# Patient Record
Sex: Female | Born: 1945
Health system: Southern US, Community
[De-identification: ages and names within clinical notes are randomized; demographics above are authoritative.]

## PROBLEM LIST (undated history)

## (undated) DIAGNOSIS — I8392 Asymptomatic varicose veins of left lower extremity: Secondary | ICD-10-CM

## (undated) DIAGNOSIS — E7800 Pure hypercholesterolemia, unspecified: Secondary | ICD-10-CM

## (undated) DIAGNOSIS — I10 Essential (primary) hypertension: Secondary | ICD-10-CM

## (undated) DIAGNOSIS — E119 Type 2 diabetes mellitus without complications: Secondary | ICD-10-CM

## (undated) DIAGNOSIS — M1990 Unspecified osteoarthritis, unspecified site: Secondary | ICD-10-CM

## (undated) HISTORY — PX: TUBAL LIGATION: SHX77

## (undated) HISTORY — PX: TONSILLECTOMY: SUR1361

## (undated) HISTORY — PX: COLONOSCOPY: SHX174

## (undated) HISTORY — DX: Type 2 diabetes mellitus without complications: E11.9

## (undated) HISTORY — DX: Unspecified osteoarthritis, unspecified site: M19.90

## (undated) HISTORY — PX: CATARACT EXTRACTION, BILATERAL: SHX1313

## (undated) HISTORY — PX: EYE SURGERY: SHX253

## (undated) HISTORY — DX: Asymptomatic varicose veins of left lower extremity: I83.92

---

## 1998-08-20 ENCOUNTER — Other Ambulatory Visit: Admission: RE | Admit: 1998-08-20 | Discharge: 1998-08-20 | Payer: Self-pay | Admitting: Gynecology

## 2000-03-15 ENCOUNTER — Other Ambulatory Visit: Admission: RE | Admit: 2000-03-15 | Discharge: 2000-03-15 | Payer: Self-pay | Admitting: Gynecology

## 2001-06-26 ENCOUNTER — Other Ambulatory Visit: Admission: RE | Admit: 2001-06-26 | Discharge: 2001-06-26 | Payer: Self-pay | Admitting: Gynecology

## 2002-08-09 ENCOUNTER — Other Ambulatory Visit: Admission: RE | Admit: 2002-08-09 | Discharge: 2002-08-09 | Payer: Self-pay | Admitting: Gynecology

## 2003-09-26 ENCOUNTER — Other Ambulatory Visit: Admission: RE | Admit: 2003-09-26 | Discharge: 2003-09-26 | Payer: Self-pay | Admitting: Gynecology

## 2004-12-24 ENCOUNTER — Other Ambulatory Visit: Admission: RE | Admit: 2004-12-24 | Discharge: 2004-12-24 | Payer: Self-pay | Admitting: Gynecology

## 2008-02-21 ENCOUNTER — Encounter: Admission: RE | Admit: 2008-02-21 | Discharge: 2008-02-21 | Payer: Self-pay | Admitting: Family Medicine

## 2010-08-11 ENCOUNTER — Other Ambulatory Visit: Payer: Self-pay | Admitting: Family Medicine

## 2010-08-11 DIAGNOSIS — Z1231 Encounter for screening mammogram for malignant neoplasm of breast: Secondary | ICD-10-CM

## 2010-08-11 DIAGNOSIS — Z780 Asymptomatic menopausal state: Secondary | ICD-10-CM

## 2010-08-26 ENCOUNTER — Ambulatory Visit
Admission: RE | Admit: 2010-08-26 | Discharge: 2010-08-26 | Disposition: A | Discharge status: Home / Self Care | Payer: BC Managed Care – PPO | Source: Ambulatory Visit | Attending: Family Medicine | Admitting: Family Medicine

## 2010-08-26 DIAGNOSIS — Z1231 Encounter for screening mammogram for malignant neoplasm of breast: Secondary | ICD-10-CM

## 2010-08-26 DIAGNOSIS — Z780 Asymptomatic menopausal state: Secondary | ICD-10-CM

## 2011-09-22 ENCOUNTER — Other Ambulatory Visit: Payer: Self-pay | Admitting: Family Medicine

## 2011-09-22 DIAGNOSIS — Z1231 Encounter for screening mammogram for malignant neoplasm of breast: Secondary | ICD-10-CM

## 2011-10-04 ENCOUNTER — Ambulatory Visit
Admission: RE | Admit: 2011-10-04 | Discharge: 2011-10-04 | Disposition: A | Discharge status: Home / Self Care | Payer: BC Managed Care – PPO | Source: Ambulatory Visit | Attending: Family Medicine | Admitting: Family Medicine

## 2011-10-04 DIAGNOSIS — Z1231 Encounter for screening mammogram for malignant neoplasm of breast: Secondary | ICD-10-CM

## 2012-10-09 ENCOUNTER — Telehealth: Payer: Self-pay | Admitting: Family Medicine

## 2012-10-09 NOTE — Telephone Encounter (Signed)
BP been running very low.  Has had some near syncopal episodes.  98/48 last pm  Then 106/55 later.  This am 102/53  Did not take BP med.  Taking HCTZ and Atenolol. Concerned  What should she do??  Per Dr Tanya Nones,  Have pt hold BP meds for 1 week.  Schedule appt to see provider then for follow up in one week.  Keep BP log and bring to appt.  Call sooner if problem.

## 2012-11-11 ENCOUNTER — Emergency Department (INDEPENDENT_AMBULATORY_CARE_PROVIDER_SITE_OTHER)
Admission: EM | Admit: 2012-11-11 | Discharge: 2012-11-11 | Disposition: A | Discharge status: Home / Self Care | Payer: Worker's Compensation | Source: Home / Self Care | Attending: Emergency Medicine | Admitting: Emergency Medicine

## 2012-11-11 ENCOUNTER — Encounter (HOSPITAL_COMMUNITY): Payer: Self-pay

## 2012-11-11 DIAGNOSIS — S60229A Contusion of unspecified hand, initial encounter: Secondary | ICD-10-CM

## 2012-11-11 DIAGNOSIS — S60222A Contusion of left hand, initial encounter: Secondary | ICD-10-CM

## 2012-11-11 DIAGNOSIS — S8000XA Contusion of unspecified knee, initial encounter: Secondary | ICD-10-CM

## 2012-11-11 DIAGNOSIS — S8002XA Contusion of left knee, initial encounter: Secondary | ICD-10-CM

## 2012-11-11 DIAGNOSIS — S60221A Contusion of right hand, initial encounter: Secondary | ICD-10-CM

## 2012-11-11 DIAGNOSIS — S8001XA Contusion of right knee, initial encounter: Secondary | ICD-10-CM

## 2012-11-11 DIAGNOSIS — W010XXA Fall on same level from slipping, tripping and stumbling without subsequent striking against object, initial encounter: Secondary | ICD-10-CM

## 2012-11-11 HISTORY — DX: Essential (primary) hypertension: I10

## 2012-11-11 HISTORY — DX: Pure hypercholesterolemia, unspecified: E78.00

## 2012-11-11 NOTE — ED Provider Notes (Signed)
Medical screening examination/treatment/procedure(s) were performed by non-physician practitioner and as supervising physician I was immediately available for consultation/collaboration.  Leslee Home, M.D.  Reuben Likes, MD 11/11/12 2004

## 2012-11-11 NOTE — ED Notes (Signed)
WC, [employer] system, states she tripped over cord and landed on both knees and hands yesterday

## 2012-11-11 NOTE — ED Provider Notes (Signed)
History     CSN: [id number]  Arrival date & time 11/11/12  1441   First MD Initiated Contact with Patient 11/11/12 1631      Chief Complaint  Patient presents with  . Fall    (Consider location/radiation/quality/duration/timing/severity/associated sxs/prior treatment) HPI Comments: Pleasant 67 year old female was at work yesterday and tripped over a cord. She landed on her knees and hands. She states she laid there and cried for a few moments then got up and walked away. Over the night and today she has had soreness in the anterior knees and in the palm of the hands. She denies problems with ambulation or weightbearing. And she denies problems with function of the hands. She did not injure her head neck, back, chest, abdomen, hips or other areas. She is fully awake, alert, oriented with normal speech, content, in And memory.   Past Medical History  Diagnosis Date  . Hypertension   . High cholesterol     History reviewed. No pertinent past surgical history.  History reviewed. No pertinent family history.  History  Substance Use Topics  . Smoking status: Not on file  . Smokeless tobacco: Not on file  . Alcohol Use: Not on file    OB History   Grav Para Term Preterm Abortions TAB SAB Ect Mult Living                  Review of Systems  Constitutional: Negative.   Respiratory: Negative.   Gastrointestinal: Negative.   Genitourinary: Negative.   Musculoskeletal: Positive for arthralgias.  Skin: Negative.   Neurological: Negative.   Psychiatric/Behavioral: Negative.     Allergies  Review of patient's allergies indicates no known allergies.  Home Medications   Current Outpatient Rx  Name  Route  Sig  Dispense  Refill  . atenolol (TENORMIN) 25 MG tablet   Oral   Take 25 mg by mouth daily.         . hydrochlorothiazide (MICROZIDE) 12.5 MG capsule   Oral   Take 12.5 mg by mouth daily.         Marland Kitchen lovastatin (MEVACOR) 10 MG tablet   Oral   Take 10 mg by  mouth at bedtime.           BP 116/54  Pulse 59  Temp(Src) 98.3 F (36.8 C) (Oral)  Resp 16  SpO2 100%  Physical Exam  Nursing note and vitals reviewed. Constitutional: She is oriented to person, place, and time. She appears well-developed and well-nourished. No distress.  HENT:  Head: Normocephalic and atraumatic.  Eyes: EOM are normal. Pupils are equal, round, and reactive to light.  Neck: Normal range of motion. Neck supple.  Cardiovascular: Normal heart sounds.   Pulmonary/Chest: Effort normal. No respiratory distress.  Musculoskeletal: Normal range of motion. She exhibits no edema.  Inspection and palpation of the knees reveals no asymmetry, discoloration or deformity. No tenderness or pain with torsion, varus valgus movement or drawer testing. She is ambulatory with a smooth imbalanced gait. And exam reveals minor superficial ecchymosis at the proximal hand palmar surface between the thenar and hyperthenar prominences. Denies wrist pain. Full range of motion of all digits, thumb opposition and wrist bilaterally. Distal neurovascular motor sensory intact.  Lymphadenopathy:    She has cervical adenopathy.  Neurological: She is alert and oriented to person, place, and time. No cranial nerve deficit. She exhibits normal muscle tone.  Skin: Skin is warm and dry.  Psychiatric: She has a normal mood and affect.  ED Course  Procedures (including critical care time)  Labs Reviewed - No data to display No results found.   1. Contusion of right knee, initial encounter   2. Contusion of left knee, initial encounter   3. Contusion, hand, left, initial encounter   4. Contusion, hand, right, initial encounter   5. Fall due to stumbling, initial encounter       MDM  Ice to the  areas of soreness for the next 2 or 3 days. Limit extra walking or climbing stairs for the next few days. Any symptoms problems or worsening may return, otherwise follow up with primary care  doctor. Written instructions on contusions of hands and knees.        Hayden Rasmussen, NP 11/11/12 1730

## 2013-01-12 ENCOUNTER — Telehealth: Payer: Self-pay | Admitting: Family Medicine

## 2013-01-12 MED ORDER — LOVASTATIN 10 MG PO TABS: 10.0000 mg | ORAL_TABLET | Freq: Every day | ORAL | Status: DC

## 2013-01-12 NOTE — Telephone Encounter (Signed)
Med refill

## 2013-01-13 ENCOUNTER — Other Ambulatory Visit: Payer: Self-pay | Admitting: Family Medicine

## 2013-03-13 ENCOUNTER — Ambulatory Visit: Payer: Self-pay | Admitting: Family Medicine

## 2013-03-14 ENCOUNTER — Ambulatory Visit (INDEPENDENT_AMBULATORY_CARE_PROVIDER_SITE_OTHER): Payer: BC Managed Care – PPO | Admitting: Physician Assistant

## 2013-03-14 ENCOUNTER — Encounter: Payer: Self-pay | Admitting: Physician Assistant

## 2013-03-14 VITALS — BP 116/60 | HR 72 | Temp 98.1°F | Resp 18 | Ht 61.0 in | Wt 174.0 lb

## 2013-03-14 DIAGNOSIS — E7800 Pure hypercholesterolemia, unspecified: Secondary | ICD-10-CM | POA: Insufficient documentation

## 2013-03-14 DIAGNOSIS — H6690 Otitis media, unspecified, unspecified ear: Secondary | ICD-10-CM

## 2013-03-14 DIAGNOSIS — I10 Essential (primary) hypertension: Secondary | ICD-10-CM | POA: Insufficient documentation

## 2013-03-14 DIAGNOSIS — H6692 Otitis media, unspecified, left ear: Secondary | ICD-10-CM

## 2013-03-14 MED ORDER — AMOXICILLIN-POT CLAVULANATE 875-125 MG PO TABS: 1.0000 | ORAL_TABLET | Freq: Two times a day (BID) | ORAL | Status: DC

## 2013-03-14 NOTE — Progress Notes (Signed)
Patient ID: Nichole Bryant MRN: [id number], DOB: Oct 21, 1945, 67 y.o. Date of Encounter: 03/14/2013, 11:35 AM    Chief Complaint:  Chief Complaint  Patient presents with  . severe lt ear pain x 4 days , fever     HPI: 67 y.o. year old white female reports that over the last 3-4 days she has been having some intermittent pain in the left ear. She has been taking either Tylenol or Motrin prior to going to bed for the last several nights. However she will wake around 4 AM with severe pain in the left ear. As well yesterday she had a fever of 101. She says that she was unable to get an appointment here since she went to an urgent care yesterday. She shows me the bottle that they prescribed it has to some cough syrup. She tells me she is having very little drainage from her nose. No significant mucous. She has no cough or chest congestion. No sore throat. She does say that sometimes when she swallows she can feel the pain in the left ear. She's had no tooth ache or pain in her teeth or oral mucosa. No pain in the TM joint or her neck her mastoid area. She has no past history of problems with a tear or this area of her body.     Home Meds: See attached medication section for any medications that were entered at today's visit. The computer does not put those onto this list.The following list is a list of meds entered prior to today's visit.   Current Outpatient Prescriptions on File Prior to Visit  Medication Sig Dispense Refill  . atenolol (TENORMIN) 25 MG tablet Take 25 mg by mouth daily.      . hydrochlorothiazide (MICROZIDE) 12.5 MG capsule Take 12.5 mg by mouth daily.      Marland Kitchen lovastatin (MEVACOR) 20 MG tablet TAKE 1 TABLET EVERY DAY  30 tablet  3   No current facility-administered medications on file prior to visit.    Allergies: No Known Allergies    Review of Systems: See HPI for pertinent ROS. All other ROS negative.    Physical Exam: Blood pressure 116/60, pulse 72,  temperature 98.1 F (36.7 C), temperature source Oral, resp. rate 18, height 5\' 1"  (1.549 m), weight 174 lb (78.926 kg)., Body mass index is 32.89 kg/(m^2). General: Well-nourished well-developed white female. Appears in no acute distress. HEENT: Normocephalic, atraumatic, eyes without discharge, sclera non-icteric, nares are without discharge. Bilateral auditory canals clear, TM's are without perforation, pearly grey and translucent with reflective cone of light bilaterally. Oral cavity moist, posterior pharynx without exudate, erythema, peritonsillar abscess, or post nasal drip. Palpation of the external left ear is normal with no pain. The ear canal is normal with no erythema or edema and no purulent drainage. It is no cerumen present. Her TM is well visualized. it is slightly dull and golden colored but otherwise normal. Gums and oral mucosa are normal  Patient at the TM joint induces no pain. Palpation of the mastoid area and the neck produces no pain. No mass with palpation of the area. Neck: Supple. No thyromegaly. No lymphadenopathy. Lungs: Clear bilaterally to auscultation without wheezes, rales, or rhonchi. Breathing is unlabored. Heart: Regular rhythm. No murmurs, rubs, or gallops. Msk:  Strength and tone normal for age. Neuro: Alert and oriented X 3. Moves all extremities spontaneously. Gait is normal. CNII-XII grossly in tact. Psych:  Responds to questions appropriately with a normal affect.  ASSESSMENT AND PLAN:  67 y.o. year old female with  1. Otitis media, left - amoxicillin-clavulanate (AUGMENTIN) 875-125 MG per tablet; Take 1 tablet by mouth 2 (two) times daily.  Dispense: 20 tablet; Refill: 0 Continue to take Tylenol or Motrin before bedtime each night for the next 2 nights. She can then decrease to when necessary pain. complete all of the antibiotic. If her symptoms do not resolve then followup with [place]. If this is the case she may need followup with ENT to further  investigate the cause of her symptoms.  Signed, [address] [place], [place], [facility] 03/14/2013 11:35 AM

## 2013-03-15 ENCOUNTER — Ambulatory Visit (INDEPENDENT_AMBULATORY_CARE_PROVIDER_SITE_OTHER): Payer: BC Managed Care – PPO | Admitting: Family Medicine

## 2013-03-15 ENCOUNTER — Encounter: Payer: Self-pay | Admitting: Family Medicine

## 2013-03-15 VITALS — BP 100/72 | HR 70 | Temp 97.0°F | Resp 16 | Wt 173.0 lb

## 2013-03-15 DIAGNOSIS — H9202 Otalgia, left ear: Secondary | ICD-10-CM

## 2013-03-15 DIAGNOSIS — H6690 Otitis media, unspecified, unspecified ear: Secondary | ICD-10-CM

## 2013-03-15 DIAGNOSIS — H6692 Otitis media, unspecified, left ear: Secondary | ICD-10-CM | POA: Insufficient documentation

## 2013-03-15 DIAGNOSIS — T7840XA Allergy, unspecified, initial encounter: Secondary | ICD-10-CM

## 2013-03-15 DIAGNOSIS — H9209 Otalgia, unspecified ear: Secondary | ICD-10-CM | POA: Insufficient documentation

## 2013-03-15 MED ORDER — ANTIPYRINE-BENZOCAINE 5.4-1.4 % OT SOLN: 3.0000 [drp] | Freq: Four times a day (QID) | OTIC | Status: DC | PRN

## 2013-03-15 MED ORDER — PREDNISONE 10 MG PO TABS: ORAL_TABLET | ORAL | Status: DC

## 2013-03-15 NOTE — Assessment & Plan Note (Signed)
I think this is likely a drug reaction with her sulfa drug. I will have her stop the sulfa drug as well as the amoxicillin. She's been given Depo-Medrol today she will start prednisone taper tomorrow.

## 2013-03-15 NOTE — Assessment & Plan Note (Signed)
I do not see any evidence of otitis media or otitis externa based on exam but she is having severe pain. I will go ahead and set her up with ear nose and throat tomorrow afternoon. She was given a peak otic drops to see if this help the prednisone that she is on for the rash may also help with some of the pain. I do not see evidence of TMJ but this is a possibility

## 2013-03-15 NOTE — Patient Instructions (Signed)
Stop Sulfa Drug Do not start amoxicillin Referral to ENT Ear drop given Start prednisone tablets tomorrow Okay to take benadryl along with medications F/U as previous or call if rash does not improve

## 2013-03-15 NOTE — Progress Notes (Signed)
  Subjective:    Patient ID: Nichole Bryant, female    DOB: Aug 16, 1945, 67 y.o.   MRN: [id number]  HPI  Patient presents with allergic reaction and severe left ear pain. He was seen by late June at urgent care on September 1 was diagnosed with left otitis media and was given prescription for Bactrim one tablet twice a day for 10 days. This is day 9 of the Bactrim. She was seen yesterday secondary to continued severe ear pain despite antibiotic uses an over-the-counter ear drop medication as well as ibuprofen for her ear pain. He denies any drainage from the ear but states that she gets severe sharp shooting pains from the year every few minutes which are unbearable at times. She denies any sinus drainage. He noticed yesterday morning that she began breaking out in a record rash on her chest and back. She did take a dose of amoxicillin last night however the rash was already present. The rash is very itchy in nature is down spreading across her abdomen as well as her arms and legs. She denies any fever , N/V.   Review of Systems   GEN- denies fatigue, fever, weight loss,weakness, recent illness HEENT- denies eye drainage, change in vision, nasal discharge, CVS- denies chest pain, palpitations RESP- denies SOB, cough, wheeze Neuro- denies headache, dizziness, syncope, seizure activity Skin- + rash      Objective:   Physical Exam  GEN- NAD, alert and oriented x3 HEENT- PERRL, EOMI, non injected sclera, pink conjunctiva, MMM, oropharynx clear, TM clear bilat no effusion, no erythema noted, pain with manipulation of pinna, canal clear, no discharge, NT over mastoid, NT over TMJ, no maxillary sinus tenderness, Hearing grossly in tact  Neck- Supple, no LAD CVS- RRR, no murmur RESP-CTAB EXT- No edema Pulses- Radial 2+ Skin- generalized erythematous blanching macular rash over trunk, arms, legs, face spared, no blistering lesions, no pustules, no vesicles        Assessment & Plan:

## 2013-03-15 NOTE — Assessment & Plan Note (Signed)
Refer to ear nose and throat 

## 2013-03-21 ENCOUNTER — Other Ambulatory Visit: Payer: Self-pay | Admitting: Physician Assistant

## 2013-04-19 ENCOUNTER — Telehealth: Payer: Self-pay | Admitting: Family Medicine

## 2013-04-19 ENCOUNTER — Encounter: Payer: Self-pay | Admitting: Family Medicine

## 2013-04-19 MED ORDER — HYDROCHLOROTHIAZIDE 12.5 MG PO CAPS: 12.5000 mg | ORAL_CAPSULE | Freq: Every day | ORAL | Status: DC

## 2013-04-19 NOTE — Telephone Encounter (Signed)
HCTZ 12.5 mg QD

## 2013-04-19 NOTE — Telephone Encounter (Signed)
Medication refill for one time only.  Patient needs to be seen.  Letter sent for patient to call and schedule 

## 2013-05-07 ENCOUNTER — Ambulatory Visit: Payer: BC Managed Care – PPO | Admitting: Family Medicine

## 2013-05-12 ENCOUNTER — Other Ambulatory Visit: Payer: Self-pay | Admitting: Family Medicine

## 2013-05-14 ENCOUNTER — Ambulatory Visit: Payer: BC Managed Care – PPO | Admitting: Family Medicine

## 2013-05-14 ENCOUNTER — Other Ambulatory Visit: Payer: BC Managed Care – PPO

## 2013-05-14 DIAGNOSIS — Z79899 Other long term (current) drug therapy: Secondary | ICD-10-CM

## 2013-05-14 DIAGNOSIS — E785 Hyperlipidemia, unspecified: Secondary | ICD-10-CM

## 2013-05-14 DIAGNOSIS — I10 Essential (primary) hypertension: Secondary | ICD-10-CM

## 2013-05-14 LAB — CBC WITH DIFFERENTIAL/PLATELET
Basophils Absolute: 0 10*3/uL (ref 0.0–0.1)
Basophils Relative: 0 % (ref 0–1)
Eosinophils Relative: 2 % (ref 0–5)
Lymphocytes Relative: 32 % (ref 12–46)
MCHC: 33.3 g/dL (ref 30.0–36.0)
Neutro Abs: 4.1 10*3/uL (ref 1.7–7.7)
Platelets: 227 10*3/uL (ref 150–400)
RDW: 13.8 % (ref 11.5–15.5)
WBC: 7.1 10*3/uL (ref 4.0–10.5)

## 2013-05-14 LAB — LIPID PANEL
HDL: 51 mg/dL (ref >39)
LDL Cholesterol: 102 mg/dL — ABNORMAL HIGH (ref 0–99)
Total CHOL/HDL Ratio: 3.3 Ratio

## 2013-05-14 LAB — COMPREHENSIVE METABOLIC PANEL
ALT: 13 U/L (ref 0–35)
AST: 17 U/L (ref 0–37)
Alkaline Phosphatase: 56 U/L (ref 39–117)
Calcium: 9.6 mg/dL (ref 8.4–10.5)
Chloride: 102 mEq/L (ref 96–112)
Creat: 0.86 mg/dL (ref 0.50–1.10)
Potassium: 5 mEq/L (ref 3.5–5.3)

## 2013-05-26 ENCOUNTER — Other Ambulatory Visit: Payer: Self-pay | Admitting: Family Medicine

## 2013-05-28 ENCOUNTER — Encounter: Payer: Self-pay | Admitting: Family Medicine

## 2013-05-28 ENCOUNTER — Ambulatory Visit (INDEPENDENT_AMBULATORY_CARE_PROVIDER_SITE_OTHER): Payer: BC Managed Care – PPO | Admitting: Family Medicine

## 2013-05-28 VITALS — BP 126/80 | HR 68 | Temp 98.2°F | Resp 16 | Wt 169.0 lb

## 2013-05-28 DIAGNOSIS — R7303 Prediabetes: Secondary | ICD-10-CM

## 2013-05-28 DIAGNOSIS — R7309 Other abnormal glucose: Secondary | ICD-10-CM

## 2013-05-28 LAB — HEMOGLOBIN A1C
Hgb A1c MFr Bld: 6.1 % — ABNORMAL HIGH (ref <5.7)
Mean Plasma Glucose: 128 mg/dL — ABNORMAL HIGH (ref <117)

## 2013-05-28 MED ORDER — ATENOLOL 25 MG PO TABS: ORAL_TABLET | ORAL | Status: DC

## 2013-05-28 MED ORDER — LOVASTATIN 20 MG PO TABS: ORAL_TABLET | ORAL | Status: DC

## 2013-05-28 MED ORDER — HYDROCHLOROTHIAZIDE 12.5 MG PO CAPS: ORAL_CAPSULE | ORAL | Status: DC

## 2013-05-28 NOTE — Progress Notes (Signed)
Subjective:    Patient ID: Nichole Bryant, female    DOB: Nov 25, 1945, 67 y.o.   MRN: [id number]  HPI  Patient continues to experience numbness in both feet. She has peripheral neuropathy.  She denies any pain in her feet. Her blood pressures currently well controlled on atenolol and hydrochlorothiazide. She denies any chest pain shortness of breath or dyspnea on exertion. Her LDL is at goal at 102 given that she is now prediabetic. She is currently taking lovastatin 20 mg by mouth daily. She denies any myalgias or right upper quadrant pain. Her most recent labwork as listed below: Lab on 05/14/2013  Component Date Value Range Status  . WBC 05/14/2013 7.1  4.0 - 10.5 K/uL Final  . RBC 05/14/2013 4.76  3.87 - 5.11 MIL/uL Final  . Hemoglobin 05/14/2013 13.2  12.0 - 15.0 g/dL Final  . HCT 40/98/1191 39.6  36.0 - 46.0 % Final  . MCV 05/14/2013 83.2  78.0 - 100.0 fL Final  . MCH 05/14/2013 27.7  26.0 - 34.0 pg Final  . MCHC 05/14/2013 33.3  30.0 - 36.0 g/dL Final  . RDW 47/82/9562 13.8  11.5 - 15.5 % Final  . Platelets 05/14/2013 227  150 - 400 K/uL Final  . Neutrophils Relative % 05/14/2013 58  43 - 77 % Final  . Neutro Abs 05/14/2013 4.1  1.7 - 7.7 K/uL Final  . Lymphocytes Relative 05/14/2013 32  12 - 46 % Final  . Lymphs Abs 05/14/2013 2.3  0.7 - 4.0 K/uL Final  . Monocytes Relative 05/14/2013 8  3 - 12 % Final  . Monocytes Absolute 05/14/2013 0.6  0.1 - 1.0 K/uL Final  . Eosinophils Relative 05/14/2013 2  0 - 5 % Final  . Eosinophils Absolute 05/14/2013 0.1  0.0 - 0.7 K/uL Final  . Basophils Relative 05/14/2013 0  0 - 1 % Final  . Basophils Absolute 05/14/2013 0.0  0.0 - 0.1 K/uL Final  . Smear Review 05/14/2013 Criteria for review not met   Final  . Sodium 05/14/2013 138  135 - 145 mEq/L Final  . Potassium 05/14/2013 5.0  3.5 - 5.3 mEq/L Final  . Chloride 05/14/2013 102  96 - 112 mEq/L Final  . CO2 05/14/2013 30  19 - 32 mEq/L Final  . Glucose, Bld 05/14/2013 124* 70 - 99 mg/dL  Final  . BUN 13/02/6577 18  6 - 23 mg/dL Final  . Creat 46/96/2952 0.86  0.50 - 1.10 mg/dL Final  . Total Bilirubin 05/14/2013 0.7  0.3 - 1.2 mg/dL Final  . Alkaline Phosphatase 05/14/2013 56  39 - 117 U/L Final  . AST 05/14/2013 17  0 - 37 U/L Final  . ALT 05/14/2013 13  0 - 35 U/L Final  . Total Protein 05/14/2013 6.6  6.0 - 8.3 g/dL Final  . Albumin 84/13/2440 4.4  3.5 - 5.2 g/dL Final  . Calcium 05/01/2535 9.6  8.4 - 10.5 mg/dL Final  . Cholesterol 64/40/3474 170  0 - 200 mg/dL Final   Comment: ATP III Classification:                                < 200        mg/dL        Desirable  200 - 239     mg/dL        Borderline High                               >= 240        mg/dL        High                             . Triglycerides 05/14/2013 87  <150 mg/dL Final  . HDL 08/65/7846 51  >39 mg/dL Final  . Total CHOL/HDL Ratio 05/14/2013 3.3   Final  . VLDL 05/14/2013 17  0 - 40 mg/dL Final  . LDL Cholesterol 05/14/2013 102* 0 - 99 mg/dL Final   Comment:                            Total Cholesterol/HDL Ratio:CHD Risk                                                 Coronary Heart Disease Risk Table                                                                 Men       Women                                   1/2 Average Risk              3.4        3.3                                       Average Risk              5.0        4.4                                    2X Average Risk              9.6        7.1                                    3X Average Risk             23.4       11.0                          Use the calculated Patient Ratio above and the CHD Risk table  to determine the patient's CHD Risk.                          ATP III Classification (LDL):                                < 100        mg/dL         Optimal                               100 - 129     mg/dL         Near or Above Optimal                                130 - 159     mg/dL         Borderline High                               160 - 189     mg/dL         High                                > 190        mg/dL         Very High                              Past Medical History  Diagnosis Date  . High cholesterol   . Hypertension   . Diabetes mellitus without complication    No past surgical history on file. Current Outpatient Prescriptions on File Prior to Visit  Medication Sig Dispense Refill  . antipyrine-benzocaine (AURALGAN) otic solution Place 3 drops into the left ear 4 (four) times daily as needed for pain.  10 mL  0   No current facility-administered medications on file prior to visit.   Allergies  Allergen Reactions  . Sulfa Antibiotics    History   Social History  . Marital Status: Married    Spouse Name: N/A    Number of Children: N/A  . Years of Education: N/A   Occupational History  . Not on file.   Social History Main Topics  . Smoking status: Never Smoker   . Smokeless tobacco: Never Used  . Alcohol Use: No  . Drug Use: No  . Sexual Activity: Not on file   Other Topics Concern  . Not on file   Social History Narrative  . No narrative on file     Review of Systems  All other systems reviewed and are negative.       Objective:   Physical Exam  Vitals reviewed. Cardiovascular: Normal rate, regular rhythm and normal heart sounds.   Pulmonary/Chest: Effort normal and breath sounds normal. No respiratory distress. She has no wheezes. She has no rales.  Abdominal: Soft. Bowel sounds are normal.          Assessment & Plan:  1. Prediabetes Patient's blood pressure and cholesterol are acceptable. Unfortunately she appears to become a prediabetic. Check hemoglobin A1c. It is less than  6.5 we will start therapeutic lifestyle changes. I recommended 10-15 pounds weight loss. I recommended decreasing carbohydrates in her diet to less than 45 g per meal. I spent 10 minutes discussing a low  carbohydrate meal. Also recommended increasing aerobic exercise to 15-20 minutes every day x7 days a week. Answered all patient's questions in detail. I would recheck hemoglobin A1c in 3-6 months. - Hemoglobin A1c

## 2013-06-01 ENCOUNTER — Encounter: Payer: Self-pay | Admitting: Family Medicine

## 2013-07-14 ENCOUNTER — Other Ambulatory Visit: Payer: Self-pay | Admitting: Physician Assistant

## 2013-07-17 ENCOUNTER — Other Ambulatory Visit: Payer: Self-pay | Admitting: Physician Assistant

## 2013-07-18 ENCOUNTER — Encounter: Payer: Self-pay | Admitting: Physician Assistant

## 2013-07-18 ENCOUNTER — Ambulatory Visit (INDEPENDENT_AMBULATORY_CARE_PROVIDER_SITE_OTHER): Payer: BC Managed Care – PPO | Admitting: Physician Assistant

## 2013-07-18 VITALS — BP 130/76 | HR 60 | Temp 97.5°F | Resp 18 | Wt 178.0 lb

## 2013-07-18 DIAGNOSIS — Z23 Encounter for immunization: Secondary | ICD-10-CM

## 2013-07-18 DIAGNOSIS — S61239A Puncture wound without foreign body of unspecified finger without damage to nail, initial encounter: Secondary | ICD-10-CM

## 2013-07-18 DIAGNOSIS — S61209A Unspecified open wound of unspecified finger without damage to nail, initial encounter: Secondary | ICD-10-CM

## 2013-07-18 MED ORDER — CEPHALEXIN 500 MG PO CAPS: 500.0000 mg | ORAL_CAPSULE | Freq: Four times a day (QID) | ORAL | Status: DC

## 2013-07-18 NOTE — Progress Notes (Signed)
    Patient ID: Nichole Bryant MRN: [id number], DOB: 01/14/1946, 68 y.o. Date of Encounter: 07/18/2013, 1:36 PM    Chief Complaint:  Chief Complaint  Patient presents with  . stapler wound to left middle finger    need tetanus booster     HPI: 68 y.o. 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 Yesterday she was stapling some papers and accidentally stapled her left third finger. Says that the staple went all the way down into the finger. A coworker had to help her remove the staple.  This morning she and her husband realized that she had not had a tetanus shot in over 10 years so she decided she better come in and get this. As well she's not sure if it is starting to get infected.     Home Meds: See attached medication section for any medications that were entered at today's visit. The computer does not put those onto this list.The following list is a list of meds entered prior to today's visit.   Current Outpatient Prescriptions on File Prior to Visit  Medication Sig Dispense Refill  . atenolol (TENORMIN) 25 MG tablet TAKE 1 TABLET EVERY DAY  30 tablet  3  . hydrochlorothiazide (MICROZIDE) 12.5 MG capsule TAKE ONE CAPSULE BY MOUTH DAILY  30 capsule  11  . lovastatin (MEVACOR) 20 MG tablet TAKE 1 TABLET EVERY DAY  30 tablet  11   No current facility-administered medications on file prior to visit.    Allergies:  Allergies  Allergen Reactions  . Sulfa Antibiotics       Review of Systems: See HPI for pertinent ROS. All other ROS negative.    Physical Exam: Blood pressure 130/76, pulse 60, temperature 97.5 F (36.4 C), temperature source Oral, resp. rate 18, weight 178 lb (80.74 kg)., Body mass index is 33.65 kg/(m^2). General: WNWD WF. Appears in no acute distress. Lungs: Clear bilaterally to auscultation without wheezes, rales, or rhonchi. Breathing is unlabored. Heart: Regular rhythm. No murmurs, rubs, or gallops. Msk:  Strength and tone normal for  age. Extremities/Skin: Left third finger: Plantar surface at the tip: I can barely see the site of the puncture wounds they are so tiny narrow. The area of skin surrounding this site this same pink color as the tips of the other fingers. There is no increased erythema to suggest infection. As well there is no drainage from the wounds. Neuro: Alert and oriented X 3. Moves all extremities spontaneously. Gait is normal. CNII-XII grossly in tact. Psych:  Responds to questions appropriately with a normal affect.     ASSESSMENT AND PLAN:  68 y.o. year old female with  1. Puncture wound of finger I discussed with her that there is no sign of infection at this time. She does not need to start antibiotics now. However I told her I would go ahead and send and the antibiotics in case she needs to pick them up. If she begins to develop any increased erythema in that fingertip or develops any purulent drainage from the site, then take the antibiotics as directed. If site continues to worsen despite this, in followup. - cephALEXin (KEFLEX) 500 MG capsule; Take 1 capsule (500 mg total) by mouth 4 (four) times daily.  Dispense: 28 capsule; Refill: 0  2. Need for Tdap vaccination - Tdap vaccine greater than or equal to 7yo IM   Signed, [address] [place], [place], [facility] 07/18/2013 1:36 PM

## 2013-10-30 ENCOUNTER — Other Ambulatory Visit: Payer: Self-pay | Admitting: Family Medicine

## 2013-10-30 ENCOUNTER — Other Ambulatory Visit: Payer: Self-pay | Admitting: Physician Assistant

## 2013-10-31 ENCOUNTER — Encounter: Payer: Self-pay | Admitting: Family Medicine

## 2013-10-31 NOTE — Telephone Encounter (Signed)
Medication refill for one time only.  Patient needs to be seen.  Letter sent for patient to call and schedule 

## 2013-12-24 ENCOUNTER — Ambulatory Visit: Payer: BC Managed Care – PPO | Admitting: Family Medicine

## 2014-02-06 ENCOUNTER — Other Ambulatory Visit: Payer: Self-pay | Admitting: Family Medicine

## 2014-02-06 ENCOUNTER — Other Ambulatory Visit: Payer: BC Managed Care – PPO

## 2014-02-06 DIAGNOSIS — I10 Essential (primary) hypertension: Secondary | ICD-10-CM

## 2014-02-06 DIAGNOSIS — E785 Hyperlipidemia, unspecified: Secondary | ICD-10-CM

## 2014-02-06 DIAGNOSIS — Z79899 Other long term (current) drug therapy: Secondary | ICD-10-CM

## 2014-02-06 LAB — CBC WITH DIFFERENTIAL/PLATELET
Basophils Absolute: 0 10*3/uL (ref 0.0–0.1)
Basophils Relative: 0 % (ref 0–1)
EOS PCT: 1 % (ref 0–5)
Eosinophils Absolute: 0.1 10*3/uL (ref 0.0–0.7)
HEMATOCRIT: 38.4 % (ref 36.0–46.0)
Hemoglobin: 12.8 g/dL (ref 12.0–15.0)
LYMPHS ABS: 1.9 10*3/uL (ref 0.7–4.0)
LYMPHS PCT: 30 % (ref 12–46)
MCH: 27.8 pg (ref 26.0–34.0)
MCHC: 33.3 g/dL (ref 30.0–36.0)
MCV: 83.5 fL (ref 78.0–100.0)
MONO ABS: 0.5 10*3/uL (ref 0.1–1.0)
MONOS PCT: 8 % (ref 3–12)
Neutro Abs: 3.9 10*3/uL (ref 1.7–7.7)
Neutrophils Relative %: 61 % (ref 43–77)
Platelets: 195 10*3/uL (ref 150–400)
RBC: 4.6 MIL/uL (ref 3.87–5.11)
RDW: 14 % (ref 11.5–15.5)
WBC: 6.4 10*3/uL (ref 4.0–10.5)

## 2014-02-06 LAB — LIPID PANEL
CHOL/HDL RATIO: 3.8 ratio
CHOLESTEROL: 186 mg/dL (ref 0–200)
HDL: 49 mg/dL (ref >39)
LDL Cholesterol: 105 mg/dL — ABNORMAL HIGH (ref 0–99)
Triglycerides: 158 mg/dL — ABNORMAL HIGH (ref <150)
VLDL: 32 mg/dL (ref 0–40)

## 2014-02-06 LAB — COMPREHENSIVE METABOLIC PANEL
ALT: 16 U/L (ref 0–35)
AST: 18 U/L (ref 0–37)
Albumin: 4.2 g/dL (ref 3.5–5.2)
Alkaline Phosphatase: 61 U/L (ref 39–117)
BUN: 14 mg/dL (ref 6–23)
CALCIUM: 9.3 mg/dL (ref 8.4–10.5)
CHLORIDE: 104 meq/L (ref 96–112)
CO2: 27 meq/L (ref 19–32)
CREATININE: 0.81 mg/dL (ref 0.50–1.10)
Glucose, Bld: 106 mg/dL — ABNORMAL HIGH (ref 70–99)
Potassium: 4.6 mEq/L (ref 3.5–5.3)
Sodium: 141 mEq/L (ref 135–145)
Total Bilirubin: 0.5 mg/dL (ref 0.2–1.2)
Total Protein: 6.6 g/dL (ref 6.0–8.3)

## 2014-02-11 ENCOUNTER — Ambulatory Visit (INDEPENDENT_AMBULATORY_CARE_PROVIDER_SITE_OTHER): Payer: BC Managed Care – PPO | Admitting: Family Medicine

## 2014-02-11 ENCOUNTER — Encounter: Payer: Self-pay | Admitting: Family Medicine

## 2014-02-11 VITALS — BP 124/62 | HR 72 | Temp 98.6°F | Resp 18 | Wt 177.0 lb

## 2014-02-11 DIAGNOSIS — R7303 Prediabetes: Secondary | ICD-10-CM

## 2014-02-11 DIAGNOSIS — R7309 Other abnormal glucose: Secondary | ICD-10-CM

## 2014-02-11 DIAGNOSIS — I10 Essential (primary) hypertension: Secondary | ICD-10-CM

## 2014-02-11 DIAGNOSIS — E785 Hyperlipidemia, unspecified: Secondary | ICD-10-CM

## 2014-02-11 NOTE — Progress Notes (Signed)
Subjective:    Patient ID: Nichole Bryant, female    DOB: 1946/02/17, 68 y.o.   MRN: [id number]  HPI Patient has a history of prediabetes as well as hypertension and hyperlipidemia. She is here today for her six-month followup. On exam, however, she has an irregular heartbeat.  EKG shows normal sinus rhythm at 67 beats per minute with occasional PACs. Otherwise EKG is normal. Her most recent lab work is listed below: Lab on 02/06/2014  Component Date Value Ref Range Status  . WBC 02/06/2014 6.4  4.0 - 10.5 K/uL Final  . RBC 02/06/2014 4.60  3.87 - 5.11 MIL/uL Final  . Hemoglobin 02/06/2014 12.8  12.0 - 15.0 g/dL Final  . HCT 02/06/2014 38.4  36.0 - 46.0 % Final  . MCV 02/06/2014 83.5  78.0 - 100.0 fL Final  . MCH 02/06/2014 27.8  26.0 - 34.0 pg Final  . MCHC 02/06/2014 33.3  30.0 - 36.0 g/dL Final  . RDW 02/06/2014 14.0  11.5 - 15.5 % Final  . Platelets 02/06/2014 195  150 - 400 K/uL Final  . Neutrophils Relative % 02/06/2014 61  43 - 77 % Final  . Neutro Abs 02/06/2014 3.9  1.7 - 7.7 K/uL Final  . Lymphocytes Relative 02/06/2014 30  12 - 46 % Final  . Lymphs Abs 02/06/2014 1.9  0.7 - 4.0 K/uL Final  . Monocytes Relative 02/06/2014 8  3 - 12 % Final  . Monocytes Absolute 02/06/2014 0.5  0.1 - 1.0 K/uL Final  . Eosinophils Relative 02/06/2014 1  0 - 5 % Final  . Eosinophils Absolute 02/06/2014 0.1  0.0 - 0.7 K/uL Final  . Basophils Relative 02/06/2014 0  0 - 1 % Final  . Basophils Absolute 02/06/2014 0.0  0.0 - 0.1 K/uL Final  . Smear Review 02/06/2014 Criteria for review not met   Final  . Sodium 02/06/2014 141  135 - 145 mEq/L Final  . Potassium 02/06/2014 4.6  3.5 - 5.3 mEq/L Final  . Chloride 02/06/2014 104  96 - 112 mEq/L Final  . CO2 02/06/2014 27  19 - 32 mEq/L Final  . Glucose, Bld 02/06/2014 106* 70 - 99 mg/dL Final  . BUN 02/06/2014 14  6 - 23 mg/dL Final  . Creat 02/06/2014 0.81  0.50 - 1.10 mg/dL Final  . Total Bilirubin 02/06/2014 0.5  0.2 - 1.2 mg/dL Final  .  Alkaline Phosphatase 02/06/2014 61  39 - 117 U/L Final  . AST 02/06/2014 18  0 - 37 U/L Final  . ALT 02/06/2014 16  0 - 35 U/L Final  . Total Protein 02/06/2014 6.6  6.0 - 8.3 g/dL Final  . Albumin 02/06/2014 4.2  3.5 - 5.2 g/dL Final  . Calcium 02/06/2014 9.3  8.4 - 10.5 mg/dL Final  . Cholesterol 02/06/2014 186  0 - 200 mg/dL Final   Comment: ATP III Classification:                                < 200        mg/dL        Desirable                               200 - 239     mg/dL        Borderline High                               >=  240        mg/dL        High                             . Triglycerides 02/06/2014 158* <150 mg/dL Final  . HDL 02/06/2014 49  >39 mg/dL Final  . Total CHOL/HDL Ratio 02/06/2014 3.8   Final  . VLDL 02/06/2014 32  0 - 40 mg/dL Final  . LDL Cholesterol 02/06/2014 105* 0 - 99 mg/dL Final   Comment:                            Total Cholesterol/HDL Ratio:CHD Risk                                                 Coronary Heart Disease Risk Table                                                                 Men       Women                                   1/2 Average Risk              3.4        3.3                                       Average Risk              5.0        4.4                                    2X Average Risk              9.6        7.1                                    3X Average Risk             23.4       11.0                          Use the calculated Patient Ratio above and the CHD Risk table                           to determine the patient's CHD Risk.                          ATP III Classification (LDL):                                <  100        mg/dL         Optimal                               100 - 129     mg/dL         Near or Above Optimal                               130 - 159     mg/dL         Borderline High                               160 - 189     mg/dL         High                                > 190         mg/dL         Very High                              Patient's blood sugar is much better. Her fasting blood sugar response in the 120s down to 105.  Past Medical History  Diagnosis Date  . High cholesterol   . Hypertension   . Diabetes mellitus without complication    No past surgical history on file. Current Outpatient Prescriptions on File Prior to Visit  Medication Sig Dispense Refill  . atenolol (TENORMIN) 25 MG tablet TAKE 1 TABLET EVERY DAY  30 tablet  0  . cephALEXin (KEFLEX) 500 MG capsule Take 1 capsule (500 mg total) by mouth 4 (four) times daily.  28 capsule  0  . hydrochlorothiazide (MICROZIDE) 12.5 MG capsule TAKE ONE CAPSULE BY MOUTH DAILY  30 capsule  11  . lovastatin (MEVACOR) 20 MG tablet TAKE 1 TABLET EVERY DAY  30 tablet  3   No current facility-administered medications on file prior to visit.   Allergies  Allergen Reactions  . Sulfa Antibiotics    History   Social History  . Marital Status: Married    Spouse Name: N/A    Number of Children: N/A  . Years of Education: N/A   Occupational History  . Not on file.   Social History Main Topics  . Smoking status: Never Smoker   . Smokeless tobacco: Never Used  . Alcohol Use: No  . Drug Use: No  . Sexual Activity: Not on file   Other Topics Concern  . Not on file   Social History Narrative  . No narrative on file      Review of Systems  All other systems reviewed and are negative.      Objective:   Physical Exam  Vitals reviewed. Constitutional: She is oriented to person, place, and time. She appears well-developed and well-nourished.  Neck: Neck supple. No JVD present. No thyromegaly present.  Cardiovascular: Normal rate, regular rhythm, normal heart sounds and intact distal pulses.  Exam reveals no gallop and no friction rub.   No murmur heard. Pulmonary/Chest: Effort normal and breath sounds normal. No respiratory distress. She has no wheezes. She has no rales.  Abdominal:  Soft. Bowel  sounds are normal. She exhibits no distension. There is no tenderness. There is no rebound and no guarding.  Musculoskeletal: She exhibits no edema.  Lymphadenopathy:    She has no cervical adenopathy.  Neurological: She is alert and oriented to person, place, and time. She has normal reflexes. No cranial nerve deficit. She exhibits normal muscle tone. Coordination normal.  Skin: Skin is warm. No rash noted. No erythema.          Assessment & Plan:  1. Prediabetes Blood sugar is much better.  I emphasized the need for a low carbohydrate diet and therapeutic lifestyle changes including daily aerobic exercise and weight loss.  2. HLD (hyperlipidemia) Cholesterol is acceptable.  No changes in lovastatin at this time.  Recheck in 6 months.   3. Essential hypertension Blood pressure is acceptable.  No changes in meds at this time.  EKG shows PACs. I reassured the patient that this is normal.

## 2014-02-26 ENCOUNTER — Encounter: Payer: Self-pay | Admitting: Family Medicine

## 2014-03-18 ENCOUNTER — Other Ambulatory Visit: Payer: Self-pay | Admitting: Family Medicine

## 2014-05-13 ENCOUNTER — Encounter: Payer: Self-pay | Admitting: Physician Assistant

## 2014-05-13 ENCOUNTER — Ambulatory Visit (INDEPENDENT_AMBULATORY_CARE_PROVIDER_SITE_OTHER): Payer: BC Managed Care – PPO | Admitting: Physician Assistant

## 2014-05-13 VITALS — BP 114/78 | HR 68 | Temp 98.2°F | Resp 18 | Wt 178.0 lb

## 2014-05-13 DIAGNOSIS — J988 Other specified respiratory disorders: Secondary | ICD-10-CM

## 2014-05-13 DIAGNOSIS — B9689 Other specified bacterial agents as the cause of diseases classified elsewhere: Secondary | ICD-10-CM

## 2014-05-13 MED ORDER — AZITHROMYCIN 250 MG PO TABS
ORAL_TABLET | ORAL | Status: DC
Start: 2014-05-13 — End: 2014-07-25

## 2014-05-13 NOTE — Progress Notes (Signed)
    Patient ID: Nichole Bryant MRN: [id number], DOB: [birth year], 68 y.o. Date of Encounter: 05/13/2014, 2:19 PM    Chief Complaint:  Chief Complaint  Patient presents with  . URI     HPI: 68 y.o. year old white female reports that she has been sick for about 8 or 9 days now. Says that especially for the first couple hours in the morning she blows lots of thick green mucus from her nose. Says that she has coughed throughout the day especially anytime she tries to talk. Feels that some of the cough is secondary to drainage in her throat but also is having chest congestion. Has had no known fevers or chills. No significant sore throat and no earache.     Home Meds:   Outpatient Prescriptions Prior to Visit  Medication Sig Dispense Refill  . atenolol (TENORMIN) 25 MG tablet TAKE 1 TABLET BY MOUTH EVERY DAY 30 tablet 11  . cephALEXin (KEFLEX) 500 MG capsule Take 1 capsule (500 mg total) by mouth 4 (four) times daily. 28 capsule 0  . hydrochlorothiazide (MICROZIDE) 12.5 MG capsule TAKE ONE CAPSULE BY MOUTH DAILY 30 capsule 11  . lovastatin (MEVACOR) 20 MG tablet TAKE 1 TABLET EVERY DAY 30 tablet 3   No facility-administered medications prior to visit.    Allergies:  Allergies  Allergen Reactions  . Sulfa Antibiotics       Review of Systems: See HPI for pertinent ROS. All other ROS negative.    Physical Exam: Blood pressure 114/78, pulse 68, temperature 98.2 F (36.8 C), resp. rate 18, weight 178 lb (80.74 kg)., Body mass index is 33.65 kg/(m^2). General: WNWD WF.  Appears in no acute distress. HEENT: Normocephalic, atraumatic, eyes without discharge, sclera non-icteric, nares are without discharge. Bilateral auditory canals clear, TM's are without perforation, pearly grey and translucent with reflective cone of light bilaterally. Oral cavity moist, posterior pharynx without exudate, erythema, peritonsillar abscess. No tenderness with percussion of frontal or maxillary sinuses  bilaterally.  Neck: Supple. No thyromegaly. No lymphadenopathy. Lungs: Clear bilaterally to auscultation without wheezes, rales, or rhonchi. Breathing is unlabored. Heart: Regular rhythm. No murmurs, rubs, or gallops. Msk:  Strength and tone normal for age. Extremities/Skin: Warm and dry. Neuro: Alert and oriented X 3. Moves all extremities spontaneously. Gait is normal. CNII-XII grossly in tact. Psych:  Responds to questions appropriately with a normal affect.     ASSESSMENT AND PLAN:  68 y.o. year old female with  1. Bacterial respiratory infection - azithromycin (ZITHROMAX) 250 MG tablet; Day 1: Take 2 daily.  Days 2-5: Take 1 daily.  Dispense: 6 tablet; Refill: 0 She is to take the antibiotic as directed. Can also continue over-the-counter decongestants and cough medications as needed for symptom relief. Follow-up if symptoms do not resolve within 1 week after completion of antibiotic.  Marin Olp [place], [place], [facility] 05/13/2014 2:19 PM

## 2014-06-21 ENCOUNTER — Other Ambulatory Visit: Payer: Self-pay | Admitting: Family Medicine

## 2014-07-25 ENCOUNTER — Ambulatory Visit (INDEPENDENT_AMBULATORY_CARE_PROVIDER_SITE_OTHER): Payer: BC Managed Care – PPO | Admitting: Family Medicine

## 2014-07-25 ENCOUNTER — Encounter: Payer: Self-pay | Admitting: Family Medicine

## 2014-07-25 VITALS — BP 100/74 | HR 72 | Temp 98.2°F | Resp 16 | Ht 63.0 in | Wt 178.0 lb

## 2014-07-25 DIAGNOSIS — I872 Venous insufficiency (chronic) (peripheral): Secondary | ICD-10-CM

## 2014-07-25 NOTE — Progress Notes (Signed)
   Subjective:    Patient ID: Nichole Bryant, female    DOB: 06/29/1946, 69 y.o.   MRN: [id number]  HPI  Patient presents with years of aching heaviness in the legs distal to the knees.  She has numerous varicose veins and venous stasis dermatitis on the medial ankles bilaterally.  There is no evidence of cellulitis. There are no venous stasis ulcers. The patient is not doing anything to treat her ankle pain or swelling or varicosities. Past Medical History  Diagnosis Date  . High cholesterol   . Hypertension   . Diabetes mellitus without complication    No past surgical history on file. Current Outpatient Prescriptions on File Prior to Visit  Medication Sig Dispense Refill  . atenolol (TENORMIN) 25 MG tablet TAKE 1 TABLET BY MOUTH EVERY DAY 30 tablet 11  . hydrochlorothiazide (MICROZIDE) 12.5 MG capsule TAKE ONE CAPSULE BY MOUTH DAILY 30 capsule 11  . lovastatin (MEVACOR) 20 MG tablet TAKE 1 TABLET EVERY DAY 30 tablet 3   No current facility-administered medications on file prior to visit.   Allergies  Allergen Reactions  . Sulfa Antibiotics    History   Social History  . Marital Status: Married    Spouse Name: N/A    Number of Children: N/A  . Years of Education: N/A   Occupational History  . Not on file.   Social History Main Topics  . Smoking status: Never Smoker   . Smokeless tobacco: Never Used  . Alcohol Use: No  . Drug Use: No  . Sexual Activity: Not on file   Other Topics Concern  . Not on file   Social History Narrative    Review of Systems  All other systems reviewed and are negative.      Objective:   Physical Exam  Cardiovascular: Normal rate, regular rhythm and normal heart sounds.   Pulmonary/Chest: Effort normal and breath sounds normal. No respiratory distress. She has no wheezes. She has no rales.  Musculoskeletal: She exhibits edema.  Vitals reviewed.  +1 edema bilaterally to the knee. Venous stasis dermatitis on the medial ankles  bilaterally. Numerous varicosities in both legs.        Assessment & Plan:  Chronic venous insufficiency  I recommended knee-high compression stockings 20-30 mmHg. I urged compliance with wearing these as this will help prevent further deterioration of her condition. I recommended horse chestnut seed extract, elevating her legs periodically, and fluid pills as needed for swelling. If symptoms worsen or she develops venous ulcers, I would recommend a referral to vascular/vein specialist.

## 2014-10-01 ENCOUNTER — Other Ambulatory Visit: Payer: Self-pay | Admitting: Family Medicine

## 2014-10-01 ENCOUNTER — Encounter: Payer: Self-pay | Admitting: Family Medicine

## 2014-10-01 NOTE — Telephone Encounter (Signed)
Medication refill for one time only.  Patient needs to be seen.  Letter sent for patient to call and schedule 

## 2014-10-29 ENCOUNTER — Other Ambulatory Visit: Payer: Self-pay | Admitting: Family Medicine

## 2014-10-29 ENCOUNTER — Encounter: Payer: Self-pay | Admitting: Family Medicine

## 2014-10-29 DIAGNOSIS — R7303 Prediabetes: Secondary | ICD-10-CM

## 2014-10-29 NOTE — Telephone Encounter (Signed)
Medication refill for one time only.  Patient needs to be seen.  Letter sent for patient to call and schedule 

## 2014-12-10 ENCOUNTER — Other Ambulatory Visit: Payer: Self-pay | Admitting: Family Medicine

## 2015-02-05 ENCOUNTER — Other Ambulatory Visit: Payer: BC Managed Care – PPO

## 2015-02-05 DIAGNOSIS — I10 Essential (primary) hypertension: Secondary | ICD-10-CM

## 2015-02-05 DIAGNOSIS — E785 Hyperlipidemia, unspecified: Secondary | ICD-10-CM

## 2015-02-05 DIAGNOSIS — R7303 Prediabetes: Secondary | ICD-10-CM

## 2015-02-05 DIAGNOSIS — Z79899 Other long term (current) drug therapy: Secondary | ICD-10-CM

## 2015-02-05 LAB — LIPID PANEL
Cholesterol: 192 mg/dL (ref 125–200)
HDL: 52 mg/dL (ref >=46)
LDL Cholesterol: 123 mg/dL (ref <130)
TRIGLYCERIDES: 85 mg/dL (ref <150)
Total CHOL/HDL Ratio: 3.7 Ratio (ref <=5.0)
VLDL: 17 mg/dL (ref <30)

## 2015-02-05 LAB — COMPLETE METABOLIC PANEL WITH GFR
ALT: 20 U/L (ref 6–29)
AST: 21 U/L (ref 10–35)
Albumin: 4 g/dL (ref 3.6–5.1)
Alkaline Phosphatase: 61 U/L (ref 33–130)
BUN: 18 mg/dL (ref 7–25)
CO2: 26 mmol/L (ref 20–31)
CREATININE: 0.83 mg/dL (ref 0.50–0.99)
Calcium: 8.9 mg/dL (ref 8.6–10.4)
Chloride: 103 mmol/L (ref 98–110)
GFR, EST NON AFRICAN AMERICAN: 72 mL/min (ref >=60)
GFR, Est African American: 83 mL/min (ref >=60)
GLUCOSE: 101 mg/dL — AB (ref 70–99)
POTASSIUM: 4.2 mmol/L (ref 3.5–5.3)
Sodium: 140 mmol/L (ref 135–146)
Total Bilirubin: 0.7 mg/dL (ref 0.2–1.2)
Total Protein: 6.6 g/dL (ref 6.1–8.1)

## 2015-02-05 LAB — CBC WITH DIFFERENTIAL/PLATELET
BASOS ABS: 0 10*3/uL (ref 0.0–0.1)
BASOS PCT: 0 % (ref 0–1)
EOS ABS: 0.4 10*3/uL (ref 0.0–0.7)
Eosinophils Relative: 6 % — ABNORMAL HIGH (ref 0–5)
HCT: 39.2 % (ref 36.0–46.0)
HEMOGLOBIN: 13.1 g/dL (ref 12.0–15.0)
Lymphocytes Relative: 26 % (ref 12–46)
Lymphs Abs: 1.7 10*3/uL (ref 0.7–4.0)
MCH: 28.4 pg (ref 26.0–34.0)
MCHC: 33.4 g/dL (ref 30.0–36.0)
MCV: 85 fL (ref 78.0–100.0)
MONOS PCT: 9 % (ref 3–12)
MPV: 9.6 fL (ref 8.6–12.4)
Monocytes Absolute: 0.6 10*3/uL (ref 0.1–1.0)
NEUTROS ABS: 4 10*3/uL (ref 1.7–7.7)
NEUTROS PCT: 59 % (ref 43–77)
PLATELETS: 191 10*3/uL (ref 150–400)
RBC: 4.61 MIL/uL (ref 3.87–5.11)
RDW: 14 % (ref 11.5–15.5)
WBC: 6.7 10*3/uL (ref 4.0–10.5)

## 2015-02-05 LAB — HEMOGLOBIN A1C
Hgb A1c MFr Bld: 6.1 % — ABNORMAL HIGH (ref <5.7)
Mean Plasma Glucose: 128 mg/dL — ABNORMAL HIGH (ref <117)

## 2015-02-05 LAB — TSH: TSH: 2.445 u[IU]/mL (ref 0.350–4.500)

## 2015-02-07 ENCOUNTER — Other Ambulatory Visit: Payer: Self-pay | Admitting: Family Medicine

## 2015-02-07 ENCOUNTER — Ambulatory Visit (INDEPENDENT_AMBULATORY_CARE_PROVIDER_SITE_OTHER): Payer: Medicare Other | Admitting: Family Medicine

## 2015-02-07 ENCOUNTER — Encounter: Payer: Self-pay | Admitting: Family Medicine

## 2015-02-07 VITALS — BP 104/62 | HR 66 | Temp 98.7°F | Resp 16 | Ht 63.0 in | Wt 174.0 lb

## 2015-02-07 DIAGNOSIS — Z0000 Encounter for general adult medical examination without abnormal findings: Secondary | ICD-10-CM

## 2015-02-07 DIAGNOSIS — R0609 Other forms of dyspnea: Secondary | ICD-10-CM

## 2015-02-07 DIAGNOSIS — R0600 Dyspnea, unspecified: Secondary | ICD-10-CM

## 2015-02-07 NOTE — Progress Notes (Signed)
Subjective:    Patient ID: Nichole Bryant, female    DOB: Jan 30, 1946, 69 y.o.   MRN: [id number]  HPI Patient is here today for complete physical exam. She has no specific complaints. She is overdue for mammogram. She is overdue for Pap smear. Her last colonoscopy was in 2011. This is per her report. It was done in [place]. She states that it was normal and they did not recommend a repeat colonoscopy until she was 69 years old. She has had Pneumovax 23, and the shingles vaccine. Her tetanus shot is up-to-date. She is due for Prevnar 13. Upon review of systems, the patient does admit to an episode of chest tightness with extreme physical exertion. She denies any other episodes of chest pain. She denies any shortness of breath. She denies any dyspnea on exertion. She believes she is just out of shape. She is able to perform regular activities around her home without shortness of breath or chest pain or chest tightness. Her most recent lab work as listed below: Lab on 02/05/2015  Component Date Value Ref Range Status  . Sodium 02/05/2015 140  135 - 146 mmol/L Final  . Potassium 02/05/2015 4.2  3.5 - 5.3 mmol/L Final  . Chloride 02/05/2015 103  98 - 110 mmol/L Final  . CO2 02/05/2015 26  20 - 31 mmol/L Final  . Glucose, Bld 02/05/2015 101* 70 - 99 mg/dL Final  . BUN 02/05/2015 18  7 - 25 mg/dL Final  . Creat 02/05/2015 0.83  0.50 - 0.99 mg/dL Final  . Total Bilirubin 02/05/2015 0.7  0.2 - 1.2 mg/dL Final  . Alkaline Phosphatase 02/05/2015 61  33 - 130 U/L Final  . AST 02/05/2015 21  10 - 35 U/L Final  . ALT 02/05/2015 20  6 - 29 U/L Final  . Total Protein 02/05/2015 6.6  6.1 - 8.1 g/dL Final  . Albumin 02/05/2015 4.0  3.6 - 5.1 g/dL Final  . Calcium 02/05/2015 8.9  8.6 - 10.4 mg/dL Final  . GFR, Est African American 02/05/2015 83  >=60 mL/min Final  . GFR, Est Non African American 02/05/2015 72  >=60 mL/min Final   Comment:   The estimated GFR is a calculation valid for adults (>=60  years old) that uses the CKD-EPI algorithm to adjust for age and sex. It is   not to be used for children, pregnant women, hospitalized patients,    patients on dialysis, or with rapidly changing kidney function. According to the NKDEP, eGFR >89 is normal, 60-89 shows mild impairment, 30-59 shows moderate impairment, 15-29 shows severe impairment and <15 is ESRD.     . TSH 02/05/2015 2.445  0.350 - 4.500 uIU/mL Final  . Cholesterol 02/05/2015 192  125 - 200 mg/dL Final  . Triglycerides 02/05/2015 85  <150 mg/dL Final  . HDL 02/05/2015 52  >=46 mg/dL Final  . Total CHOL/HDL Ratio 02/05/2015 3.7  <=5.0 Ratio Final  . VLDL 02/05/2015 17  <30 mg/dL Final  . LDL Cholesterol 02/05/2015 123  <130 mg/dL Final   Comment:   Total Cholesterol/HDL Ratio:CHD Risk                        Coronary Heart Disease Risk Table                                        Men  Women          1/2 Average Risk              3.4        3.3              Average Risk              5.0        4.4           2X Average Risk              9.6        7.1           3X Average Risk             23.4       11.0 Use the calculated Patient Ratio above and the CHD Risk table  to determine the patient's CHD Risk. ATP III Classification (LDL):       < 100        mg/dL         Optimal      100 - 129     mg/dL         Near or Above Optimal      130 - 159     mg/dL         Borderline High      160 - 189     mg/dL         High       > 190        mg/dL         Very High     . WBC 02/05/2015 6.7  4.0 - 10.5 K/uL Final  . RBC 02/05/2015 4.61  3.87 - 5.11 MIL/uL Final  . Hemoglobin 02/05/2015 13.1  12.0 - 15.0 g/dL Final  . HCT 02/05/2015 39.2  36.0 - 46.0 % Final  . MCV 02/05/2015 85.0  78.0 - 100.0 fL Final  . MCH 02/05/2015 28.4  26.0 - 34.0 pg Final  . MCHC 02/05/2015 33.4  30.0 - 36.0 g/dL Final  . RDW 02/05/2015 14.0  11.5 - 15.5 % Final  . Platelets 02/05/2015 191  150 - 400 K/uL Final  . MPV 02/05/2015 9.6  8.6 - 12.4  fL Final  . Neutrophils Relative % 02/05/2015 59  43 - 77 % Final  . Neutro Abs 02/05/2015 4.0  1.7 - 7.7 K/uL Final  . Lymphocytes Relative 02/05/2015 26  12 - 46 % Final  . Lymphs Abs 02/05/2015 1.7  0.7 - 4.0 K/uL Final  . Monocytes Relative 02/05/2015 9  3 - 12 % Final  . Monocytes Absolute 02/05/2015 0.6  0.1 - 1.0 K/uL Final  . Eosinophils Relative 02/05/2015 6* 0 - 5 % Final  . Eosinophils Absolute 02/05/2015 0.4  0.0 - 0.7 K/uL Final  . Basophils Relative 02/05/2015 0  0 - 1 % Final  . Basophils Absolute 02/05/2015 0.0  0.0 - 0.1 K/uL Final  . Smear Review 02/05/2015 Criteria for review not met   Final  . Hgb A1c MFr Bld 02/05/2015 6.1* <5.7 % Final   Comment:  According to the ADA Clinical Practice Recommendations for 2011, when HbA1c is used as a screening test:     >=6.5%   Diagnostic of Diabetes Mellitus            (if abnormal result is confirmed)   5.7-6.4%   Increased risk of developing Diabetes Mellitus   References:Diagnosis and Classification of Diabetes Mellitus,Diabetes BVAP,0141,03(UDTHY 1):S62-S69 and Standards of Medical Care in         Diabetes - 2011,Diabetes HOOI,7579,72 (Suppl 1):S11-S61.     . Mean Plasma Glucose 02/05/2015 128* <117 mg/dL Final   Comment:   Footnotes:  (1) ** Please note change in unit of measure and reference range(s). **      Past Medical History  Diagnosis Date  . High cholesterol   . Hypertension   . Diabetes mellitus without complication    No past surgical history on file. Current Outpatient Prescriptions on File Prior to Visit  Medication Sig Dispense Refill  . atenolol (TENORMIN) 25 MG tablet TAKE 1 TABLET BY MOUTH EVERY DAY 30 tablet 11  . hydrochlorothiazide (MICROZIDE) 12.5 MG capsule TAKE ONE CAPSULE BY MOUTH DAILY 30 capsule 11  . lovastatin (MEVACOR) 20 MG tablet TAKE 1 TABLET EVERY DAY 30 tablet 2   No current facility-administered  medications on file prior to visit.   Allergies  Allergen Reactions  . Sulfa Antibiotics    History   Social History  . Marital Status: Married    Spouse Name: N/A  . Number of Children: N/A  . Years of Education: N/A   Occupational History  . Not on file.   Social History Main Topics  . Smoking status: Never Smoker   . Smokeless tobacco: Never Used  . Alcohol Use: No  . Drug Use: No  . Sexual Activity: Not on file   Other Topics Concern  . Not on file   Social History Narrative   No family history on file.    Review of Systems  All other systems reviewed and are negative.      Objective:   Physical Exam  Constitutional: She is oriented to person, place, and time. She appears well-developed and well-nourished. No distress.  HENT:  Head: Normocephalic and atraumatic.  Right Ear: External ear normal.  Left Ear: External ear normal.  Nose: Nose normal.  Mouth/Throat: Oropharynx is clear and moist. No oropharyngeal exudate.  Eyes: Conjunctivae and EOM are normal. Pupils are equal, round, and reactive to light. Right eye exhibits no discharge. Left eye exhibits no discharge. No scleral icterus.  Neck: Normal range of motion. Neck supple. No JVD present. No thyromegaly present.  Cardiovascular: Normal rate, regular rhythm, normal heart sounds and intact distal pulses.  Exam reveals no gallop and no friction rub.   No murmur heard. Pulmonary/Chest: Effort normal and breath sounds normal. No stridor. No respiratory distress. She has no wheezes. She has no rales. She exhibits no tenderness.  Abdominal: Soft. Bowel sounds are normal. She exhibits no distension and no mass. There is no tenderness. There is no rebound and no guarding.  Musculoskeletal: She exhibits edema. She exhibits no tenderness.       Cervical back: She exhibits decreased range of motion and deformity.  Lymphadenopathy:    She has no cervical adenopathy.  Neurological: She is alert and oriented to  person, place, and time. She has normal reflexes. She displays normal reflexes. No cranial nerve deficit. She exhibits normal muscle tone. Coordination normal.  Skin: Skin is warm. No rash  noted. She is not diaphoretic. No erythema. No pallor.  Psychiatric: She has a normal mood and affect. Her behavior is normal. Judgment and thought content normal.  Vitals reviewed.  Patient has pronounced cervical kyphosis. She also has numerous verrucous veins in both legs as well as a large varicosity just superior to the right medial malleolus       Assessment & Plan:  Routine general medical examination at a health care facility - Plan: EKG 12-Lead  Patient's lab work is normal except for prediabetes. I recommended a low carbohydrate diet exercise and weight loss. After a long discussion, the patient will agree to a mammogram. Due to her age she does not require a Pap smear. Her colonoscopy is up-to-date. She will received Prevnar 13 were she is here. I will bring her immunizations up-to-date. I will perform an EKG today because of her dyspnea on exertion. EKG today reveals normal sinus rhythm. She does have a Q-wave in lead 3 as well as poor R-wave progression in the precordial leads.  However this is unchanged compared to her EKG of last year.  I have asked the patient to call me immediately if she develops significant dyspnea on exertion or increasing dyspnea on exertion so that we can arrange a stress test. Otherwise we will monitor clinically for the time being and treat this is deconditioning. I have recommended gradual increasing aerobic activity

## 2015-03-05 ENCOUNTER — Other Ambulatory Visit: Payer: Self-pay | Admitting: Family Medicine

## 2015-03-06 ENCOUNTER — Other Ambulatory Visit: Payer: Self-pay | Admitting: Family Medicine

## 2015-03-06 DIAGNOSIS — Z1231 Encounter for screening mammogram for malignant neoplasm of breast: Secondary | ICD-10-CM

## 2015-03-13 ENCOUNTER — Ambulatory Visit
Admission: RE | Admit: 2015-03-13 | Discharge: 2015-03-13 | Disposition: A | Discharge status: Home / Self Care | Payer: Medicare Other | Source: Ambulatory Visit | Attending: Family Medicine | Admitting: Family Medicine

## 2015-03-13 DIAGNOSIS — Z1231 Encounter for screening mammogram for malignant neoplasm of breast: Secondary | ICD-10-CM

## 2015-03-17 ENCOUNTER — Other Ambulatory Visit: Payer: Self-pay | Admitting: Family Medicine

## 2015-03-17 DIAGNOSIS — R928 Other abnormal and inconclusive findings on diagnostic imaging of breast: Secondary | ICD-10-CM

## 2015-03-18 ENCOUNTER — Ambulatory Visit
Admission: RE | Admit: 2015-03-18 | Discharge: 2015-03-18 | Disposition: A | Discharge status: Home / Self Care | Payer: Medicare Other | Source: Ambulatory Visit | Attending: Family Medicine | Admitting: Family Medicine

## 2015-03-18 DIAGNOSIS — R928 Other abnormal and inconclusive findings on diagnostic imaging of breast: Secondary | ICD-10-CM

## 2015-03-31 ENCOUNTER — Other Ambulatory Visit: Payer: Self-pay | Admitting: Family Medicine

## 2015-04-22 ENCOUNTER — Ambulatory Visit (INDEPENDENT_AMBULATORY_CARE_PROVIDER_SITE_OTHER): Payer: BC Managed Care – PPO | Admitting: Family Medicine

## 2015-04-22 DIAGNOSIS — Z23 Encounter for immunization: Secondary | ICD-10-CM | POA: Diagnosis not present

## 2015-06-29 ENCOUNTER — Other Ambulatory Visit: Payer: Self-pay | Admitting: Family Medicine

## 2015-09-05 ENCOUNTER — Other Ambulatory Visit: Payer: Self-pay | Admitting: Family Medicine

## 2015-09-05 MED ORDER — HYDROCHLOROTHIAZIDE 12.5 MG PO CAPS: 12.5000 mg | ORAL_CAPSULE | Freq: Every day | ORAL | Status: DC

## 2015-09-05 MED ORDER — ATENOLOL 25 MG PO TABS: 25.0000 mg | ORAL_TABLET | Freq: Every day | ORAL | Status: DC

## 2015-09-05 MED ORDER — LOVASTATIN 20 MG PO TABS: 20.0000 mg | ORAL_TABLET | Freq: Every day | ORAL | Status: DC

## 2015-09-05 NOTE — Telephone Encounter (Signed)
Medication called/sent to requested pharmacy  

## 2015-09-07 ENCOUNTER — Other Ambulatory Visit: Payer: Self-pay | Admitting: Family Medicine

## 2015-09-22 ENCOUNTER — Ambulatory Visit (INDEPENDENT_AMBULATORY_CARE_PROVIDER_SITE_OTHER): Payer: Medicare Other | Admitting: Family Medicine

## 2015-09-22 ENCOUNTER — Ambulatory Visit (INDEPENDENT_AMBULATORY_CARE_PROVIDER_SITE_OTHER): Payer: Medicare Other | Admitting: *Deleted

## 2015-09-22 ENCOUNTER — Encounter: Payer: Self-pay | Admitting: Family Medicine

## 2015-09-22 VITALS — BP 120/70 | HR 68 | Temp 98.2°F | Resp 14 | Ht 63.0 in | Wt 179.0 lb

## 2015-09-22 DIAGNOSIS — E785 Hyperlipidemia, unspecified: Secondary | ICD-10-CM

## 2015-09-22 DIAGNOSIS — Z23 Encounter for immunization: Secondary | ICD-10-CM | POA: Diagnosis not present

## 2015-09-22 DIAGNOSIS — I872 Venous insufficiency (chronic) (peripheral): Secondary | ICD-10-CM | POA: Diagnosis not present

## 2015-09-22 DIAGNOSIS — I10 Essential (primary) hypertension: Secondary | ICD-10-CM | POA: Diagnosis not present

## 2015-09-22 DIAGNOSIS — R7303 Prediabetes: Secondary | ICD-10-CM | POA: Diagnosis not present

## 2015-09-22 DIAGNOSIS — Z0000 Encounter for general adult medical examination without abnormal findings: Secondary | ICD-10-CM

## 2015-09-22 LAB — HEMOGLOBIN A1C
Hgb A1c MFr Bld: 6.2 % — ABNORMAL HIGH (ref <5.7)
MEAN PLASMA GLUCOSE: 131 mg/dL — AB (ref <117)

## 2015-09-22 LAB — CBC WITH DIFFERENTIAL/PLATELET
Basophils Absolute: 0 10*3/uL (ref 0.0–0.1)
Basophils Relative: 0 % (ref 0–1)
EOS ABS: 0.1 10*3/uL (ref 0.0–0.7)
Eosinophils Relative: 2 % (ref 0–5)
HCT: 39.4 % (ref 36.0–46.0)
HEMOGLOBIN: 12.8 g/dL (ref 12.0–15.0)
LYMPHS ABS: 1.7 10*3/uL (ref 0.7–4.0)
Lymphocytes Relative: 30 % (ref 12–46)
MCH: 27.8 pg (ref 26.0–34.0)
MCHC: 32.5 g/dL (ref 30.0–36.0)
MCV: 85.5 fL (ref 78.0–100.0)
MONOS PCT: 6 % (ref 3–12)
MPV: 10 fL (ref 8.6–12.4)
Monocytes Absolute: 0.3 10*3/uL (ref 0.1–1.0)
NEUTROS ABS: 3.5 10*3/uL (ref 1.7–7.7)
NEUTROS PCT: 62 % (ref 43–77)
Platelets: 177 10*3/uL (ref 150–400)
RBC: 4.61 MIL/uL (ref 3.87–5.11)
RDW: 13.5 % (ref 11.5–15.5)
WBC: 5.6 10*3/uL (ref 4.0–10.5)

## 2015-09-22 LAB — COMPLETE METABOLIC PANEL WITH GFR
ALBUMIN: 4.1 g/dL (ref 3.6–5.1)
ALK PHOS: 56 U/L (ref 33–130)
ALT: 17 U/L (ref 6–29)
AST: 19 U/L (ref 10–35)
BILIRUBIN TOTAL: 0.6 mg/dL (ref 0.2–1.2)
BUN: 15 mg/dL (ref 7–25)
CO2: 28 mmol/L (ref 20–31)
Calcium: 9 mg/dL (ref 8.6–10.4)
Chloride: 101 mmol/L (ref 98–110)
Creat: 0.88 mg/dL (ref 0.60–0.93)
GFR, EST AFRICAN AMERICAN: 77 mL/min (ref >=60)
GFR, EST NON AFRICAN AMERICAN: 67 mL/min (ref >=60)
Glucose, Bld: 103 mg/dL — ABNORMAL HIGH (ref 70–99)
Potassium: 4.2 mmol/L (ref 3.5–5.3)
Sodium: 139 mmol/L (ref 135–146)
TOTAL PROTEIN: 6.4 g/dL (ref 6.1–8.1)

## 2015-09-22 NOTE — Progress Notes (Signed)
Subjective:    Patient ID: Nichole Bryant, female    DOB: 06-05-1946, 70 y.o.   MRN: [id number]  HPI 02/2015 Patient is here today for complete physical exam. She has no specific complaints. She is overdue for mammogram. She is overdue for Pap smear. Her last colonoscopy was in 2011. This is per her report. It was done in [place]. She states that it was normal and they did not recommend a repeat colonoscopy until she was 70 years old. She has had Pneumovax 23, and the shingles vaccine. Her tetanus shot is up-to-date. She is due for Prevnar 13. Upon review of systems, the patient does admit to an episode of chest tightness with extreme physical exertion. She denies any other episodes of chest pain. She denies any shortness of breath. She denies any dyspnea on exertion. She believes she is just out of shape. She is able to perform regular activities around her home without shortness of breath or chest pain or chest tightness.  At that time, my plan was: Patient's lab work is normal except for prediabetes. I recommended a low carbohydrate diet exercise and weight loss. After a long discussion, the patient will agree to a mammogram. Due to her age she does not require a Pap smear. Her colonoscopy is up-to-date. She will received Prevnar 13 were she is here. I will bring her immunizations up-to-date. I will perform an EKG today because of her dyspnea on exertion. EKG today reveals normal sinus rhythm. She does have a Q-wave in lead 3 as well as poor R-wave progression in the precordial leads.  However this is unchanged compared to her EKG of last year.  I have asked the patient to call me immediately if she develops significant dyspnea on exertion or increasing dyspnea on exertion so that we can arrange a stress test. Otherwise we will monitor clinically for the time being and treat this is deconditioning. I have recommended gradual increasing aerobic activity.  09/22/15 Patient is here today for a  follow-up. She has gained 5 pounds since her last visit. She is not eating a lot of bread. She does not drink any sodas or sweet tea. She does eat a lot of potatoes. She is active but she is not engaging in any dedicated exercise. She is due for fasting lab work to monitor her prediabetes.  Mammogram was performed in September and was normal. The remainder of her preventative care is up-to-date as it was just performed in August. Past Medical History  Diagnosis Date  . High cholesterol   . Hypertension   . Diabetes mellitus without complication ([place])    No past surgical history on file. Current Outpatient Prescriptions on File Prior to Visit  Medication Sig Dispense Refill  . atenolol (TENORMIN) 25 MG tablet Take 1 tablet (25 mg total) by mouth daily. 90 tablet 2  . hydrochlorothiazide (MICROZIDE) 12.5 MG capsule TAKE ONE CAPSULE BY MOUTH DAILY 30 capsule 6  . lovastatin (MEVACOR) 20 MG tablet TAKE 1 TABLET EVERY DAY 30 tablet 5   No current facility-administered medications on file prior to visit.   Allergies  Allergen Reactions  . Sulfa Antibiotics    Social History   Social History  . Marital Status: Married    Spouse Name: N/A  . Number of Children: N/A  . Years of Education: N/A   Occupational History  . Not on file.   Social History Main Topics  . Smoking status: Never Smoker   . Smokeless tobacco: Never  Used  . Alcohol Use: No  . Drug Use: No  . Sexual Activity: Not on file   Other Topics Concern  . Not on file   Social History Narrative   No family history on file.    Review of Systems  All other systems reviewed and are negative.      Objective:   Physical Exam  Constitutional: She is oriented to person, place, and time. She appears well-developed and well-nourished. No distress.  HENT:  Head: Normocephalic and atraumatic.  Right Ear: External ear normal.  Left Ear: External ear normal.  Nose: Nose normal.  Mouth/Throat: Oropharynx is clear and  moist. No oropharyngeal exudate.  Eyes: Conjunctivae and EOM are normal. Pupils are equal, round, and reactive to light. Right eye exhibits no discharge. Left eye exhibits no discharge. No scleral icterus.  Neck: Normal range of motion. Neck supple. No JVD present. No thyromegaly present.  Cardiovascular: Normal rate, regular rhythm, normal heart sounds and intact distal pulses.  Exam reveals no gallop and no friction rub.   No murmur heard. Pulmonary/Chest: Effort normal and breath sounds normal. No stridor. No respiratory distress. She has no wheezes. She has no rales. She exhibits no tenderness.  Abdominal: Soft. Bowel sounds are normal. She exhibits no distension and no mass. There is no tenderness. There is no rebound and no guarding.  Musculoskeletal: She exhibits edema. She exhibits no tenderness.       Cervical back: She exhibits decreased range of motion and deformity.  Lymphadenopathy:    She has no cervical adenopathy.  Neurological: She is alert and oriented to person, place, and time. She has normal reflexes. No cranial nerve deficit. She exhibits normal muscle tone. Coordination normal.  Skin: Skin is warm. No rash noted. She is not diaphoretic. No erythema. No pallor.  Psychiatric: She has a normal mood and affect. Her behavior is normal. Judgment and thought content normal.  Vitals reviewed.  Patient has pronounced cervical kyphosis. She also has numerous verrucous veins in both legs as well as a large varicosity just superior to the right medial malleolus       Assessment & Plan:  Essential hypertension  HLD (hyperlipidemia)  Prediabetes - Plan: CBC with Differential/Platelet, COMPLETE METABOLIC PANEL WITH GFR, Hemoglobin A1c  Chronic venous insufficiency Her blood pressure is excellent. I will make no changes in her blood pressure medication. I will check a fasting lipid panel. Her goal LDL cholesterol is less than 100. I will also monitor CBC. I will check a hemoglobin  A1c. Her goal hemoglobin A1c is less than 6.5. I recommended 30 minutes a day of aerobic exercise. Also recommended light weight training primarily focusing on the hip flexors and hip extensors along with the knee flexors and knee extensors. At the present time, the patient does not request treatment for the arthritis in her cervical spine or the venous insufficiency and varicose veins in her legs. Her immunizations are up-to-date. Her cancer screening is up-to-date.

## 2016-03-27 ENCOUNTER — Other Ambulatory Visit: Payer: Self-pay | Admitting: Family Medicine

## 2016-03-31 ENCOUNTER — Other Ambulatory Visit: Payer: Self-pay | Admitting: Family Medicine

## 2016-03-31 MED ORDER — HYDROCHLOROTHIAZIDE 12.5 MG PO CAPS: 12.5000 mg | ORAL_CAPSULE | Freq: Every day | ORAL | 2 refills | Status: DC

## 2016-04-06 ENCOUNTER — Ambulatory Visit (INDEPENDENT_AMBULATORY_CARE_PROVIDER_SITE_OTHER): Payer: Medicare Other

## 2016-04-06 DIAGNOSIS — Z23 Encounter for immunization: Secondary | ICD-10-CM | POA: Diagnosis not present

## 2016-04-29 ENCOUNTER — Other Ambulatory Visit: Payer: Self-pay | Admitting: Family Medicine

## 2016-04-29 DIAGNOSIS — Z1231 Encounter for screening mammogram for malignant neoplasm of breast: Secondary | ICD-10-CM

## 2016-06-03 ENCOUNTER — Ambulatory Visit
Admission: RE | Admit: 2016-06-03 | Discharge: 2016-06-03 | Disposition: A | Discharge status: Home / Self Care | Payer: Medicare Other | Source: Ambulatory Visit | Attending: Family Medicine | Admitting: Family Medicine

## 2016-06-03 DIAGNOSIS — Z1231 Encounter for screening mammogram for malignant neoplasm of breast: Secondary | ICD-10-CM

## 2016-06-17 ENCOUNTER — Other Ambulatory Visit: Payer: Self-pay | Admitting: Family Medicine

## 2016-08-23 ENCOUNTER — Encounter: Payer: Self-pay | Admitting: Physician Assistant

## 2016-08-23 ENCOUNTER — Ambulatory Visit: Payer: Medicare Other | Admitting: Family Medicine

## 2016-08-23 ENCOUNTER — Ambulatory Visit (INDEPENDENT_AMBULATORY_CARE_PROVIDER_SITE_OTHER): Payer: Medicare Other | Admitting: Physician Assistant

## 2016-08-23 VITALS — BP 110/68 | HR 62 | Temp 98.1°F | Resp 16 | Wt 182.8 lb

## 2016-08-23 DIAGNOSIS — R2241 Localized swelling, mass and lump, right lower limb: Secondary | ICD-10-CM | POA: Diagnosis not present

## 2016-08-23 DIAGNOSIS — I8393 Asymptomatic varicose veins of bilateral lower extremities: Secondary | ICD-10-CM

## 2016-08-23 NOTE — Progress Notes (Signed)
    Patient ID: Nichole Bryant MRN: [id number], DOB: [birth year], 71 y.o. Date of Encounter: 08/23/2016, 12:43 PM    Chief Complaint:  Chief Complaint  Patient presents with  . knot on right leg    tender, itches     HPI: 71 y.o. year old female presents with above.   Says she has noticed this knot on the lateral aspect of her right lower leg for a couple of months. Family members encouraged her to come and get it evaluated.     Home Meds:   Outpatient Medications Prior to Visit  Medication Sig Dispense Refill  . atenolol (TENORMIN) 25 MG tablet Take 1 tablet (25 mg total) by mouth daily. 90 tablet 2  . Calcium Carb-Cholecalciferol (CALCIUM 1000 + D PO) Take by mouth.    . hydrochlorothiazide (MICROZIDE) 12.5 MG capsule Take 1 capsule (12.5 mg total) by mouth daily. 90 capsule 2  . lovastatin (MEVACOR) 20 MG tablet TAKE 1 TABLET EVERY DAY 30 tablet 5  . lovastatin (MEVACOR) 20 MG tablet TAKE 1 TABLET BY MOUTH EVERY DAY 90 tablet 2   No facility-administered medications prior to visit.     Allergies:  Allergies  Allergen Reactions  . Sulfa Antibiotics       Review of Systems: See HPI for pertinent ROS. All other ROS negative.    Physical Exam: Blood pressure 110/68, pulse 62, temperature 98.1 F (36.7 C), temperature source Oral, resp. rate 16, weight 182 lb 12.8 oz (82.9 kg), SpO2 98 %., Body mass index is 32.38 kg/m. General: WF.  Appears in no acute distress. Neck: Supple. No thyromegaly. No lymphadenopathy. Lungs: Clear bilaterally to auscultation without wheezes, rales, or rhonchi. Breathing is unlabored. Heart: Regular rhythm. No murmurs, rubs, or gallops. Msk:  Strength and tone normal for age. Extremities/Skin: She has a lot of varicose veins. On lateral aspect of right lower leg there is an area of protrusion--- ~ 1.5cm diameter---raised ~ 0.25cm. Palpation of this area--it is firm--no distinct borders--nonmobile.  There is no erythema, no warmth.  There  are varicose veins inferior to this, extending up to site of protrusion.  Neuro: Alert and oriented X 3. Moves all extremities spontaneously. Gait is normal. CNII-XII grossly in tact. Psych:  Responds to questions appropriately with a normal affect.     ASSESSMENT AND PLAN:  71 y.o. year old female with  1. Mass of right lower leg Suspect that this is related to the varicose veins. Will obtain ultrasound to further evaluate. She is agreeable to have ultrasound performed. I will f/u with her once I get ultrasound results. - US Venous Img Lower Unilateral Right; Future - [place] RT LOWER EXTREM LTD SOFT TISSUE NON VASCULAR; Future  2. Varicose veins of both lower extremities - US Venous Img Lower Unilateral Right; Future   Signed, [address] [place], [place], [facility] 08/23/2016 12:43 PM

## 2016-08-26 ENCOUNTER — Ambulatory Visit
Admission: RE | Admit: 2016-08-26 | Discharge: 2016-08-26 | Disposition: A | Discharge status: Home / Self Care | Payer: Medicare Other | Source: Ambulatory Visit | Attending: Physician Assistant | Admitting: Physician Assistant

## 2016-08-26 DIAGNOSIS — I8393 Asymptomatic varicose veins of bilateral lower extremities: Secondary | ICD-10-CM | POA: Diagnosis not present

## 2016-08-26 DIAGNOSIS — R2241 Localized swelling, mass and lump, right lower limb: Secondary | ICD-10-CM

## 2016-09-30 ENCOUNTER — Other Ambulatory Visit: Payer: Self-pay | Admitting: Family Medicine

## 2016-10-17 ENCOUNTER — Other Ambulatory Visit: Payer: Self-pay | Admitting: Family Medicine

## 2017-03-17 ENCOUNTER — Other Ambulatory Visit: Payer: Self-pay | Admitting: Family Medicine

## 2017-03-30 ENCOUNTER — Other Ambulatory Visit: Payer: Self-pay | Admitting: Family Medicine

## 2017-04-28 ENCOUNTER — Ambulatory Visit: Payer: Medicare Other

## 2017-05-03 ENCOUNTER — Other Ambulatory Visit: Payer: Self-pay | Admitting: Family Medicine

## 2017-05-03 DIAGNOSIS — Z1231 Encounter for screening mammogram for malignant neoplasm of breast: Secondary | ICD-10-CM

## 2017-05-19 ENCOUNTER — Encounter: Payer: Medicare Other | Admitting: Family Medicine

## 2017-05-21 ENCOUNTER — Other Ambulatory Visit: Payer: Self-pay | Admitting: Family Medicine

## 2017-06-06 ENCOUNTER — Ambulatory Visit
Admission: RE | Admit: 2017-06-06 | Discharge: 2017-06-06 | Disposition: A | Discharge status: Home / Self Care | Payer: Medicare Other | Source: Ambulatory Visit | Attending: Family Medicine | Admitting: Family Medicine

## 2017-06-06 DIAGNOSIS — Z1231 Encounter for screening mammogram for malignant neoplasm of breast: Secondary | ICD-10-CM

## 2017-06-07 ENCOUNTER — Ambulatory Visit (INDEPENDENT_AMBULATORY_CARE_PROVIDER_SITE_OTHER): Payer: Medicare Other | Admitting: Family Medicine

## 2017-06-07 VITALS — BP 130/70 | HR 78 | Temp 98.1°F | Resp 14 | Ht 63.0 in | Wt 180.0 lb

## 2017-06-07 DIAGNOSIS — I10 Essential (primary) hypertension: Secondary | ICD-10-CM | POA: Diagnosis not present

## 2017-06-07 DIAGNOSIS — Z0000 Encounter for general adult medical examination without abnormal findings: Secondary | ICD-10-CM

## 2017-06-07 DIAGNOSIS — R7303 Prediabetes: Secondary | ICD-10-CM

## 2017-06-07 DIAGNOSIS — E7800 Pure hypercholesterolemia, unspecified: Secondary | ICD-10-CM | POA: Diagnosis not present

## 2017-06-07 NOTE — Progress Notes (Signed)
Subjective:    Patient ID: Nichole Bryant, female    DOB: 1946/03/09, 71 y.o.   MRN: [id number]  HPI  Patient is here today for a complete physical exam.  Her mammogram was just performed yesterday and was normal.  She is over the age of 10 and therefore no longer requires Pap smears.  Her colonoscopy was performed approximately 6 years ago.  She states that it was normal and she refuses to have another colonoscopy performed.  However this would still be up-to-date.  Her immunization list is below: Immunization History  Administered Date(s) Administered  . Influenza Split 04/25/2013  . Influenza,inj,Quad PF,6+ Mos 04/22/2015, 04/06/2016  . Influenza-Unspecified 05/09/2017  . Pneumococcal Conjugate-13 09/22/2015  . Pneumococcal Polysaccharide-23 07/07/2012  . Td 11/01/2000  . Tdap 07/18/2013  . Zoster 02/11/2014   Patient denies any depression.  She denies any falls.  She denies any difficulties performing activities of daily living.  Overall she is doing well with no concerns.  She does take meloxicam on a daily basis for arthritis in her neck, in her shoulder, and in her knee.  She is not taking any type of heartburn medication for stomach protection.  I did recommend taking Zantac over-the-counter.  Her only concern is waking up 2 and 3 times every night to go urinate.  She also has to urinate more frequently during the day.  She is also noticed some pitting edema in both legs.  She has varicose veins in both legs.  The pitting edema is minimal and trace at best.  She denies any chest pain shortness of breath dyspnea on exertion orthopnea Past Medical History:  Diagnosis Date  . Diabetes mellitus without complication ([place])   . High cholesterol   . Hypertension    No past surgical history on file. Current Outpatient Medications on File Prior to Visit  Medication Sig Dispense Refill  . atenolol (TENORMIN) 25 MG tablet TAKE 1 TABLET BY MOUTH EVERY DAY 90 tablet 1  . Calcium  Carb-Cholecalciferol (CALCIUM 1000 + D PO) Take by mouth.    . hydrochlorothiazide (MICROZIDE) 12.5 MG capsule Take 1 capsule (12.5 mg total) by mouth daily. 90 capsule 2  . lovastatin (MEVACOR) 20 MG tablet TAKE 1 TABLET EVERY DAY 30 tablet 5  . lovastatin (MEVACOR) 20 MG tablet TAKE 1 TABLET BY MOUTH EVERY DAY 90 tablet 0  . meloxicam (MOBIC) 7.5 MG tablet Take 7.5 mg by mouth daily.  3  . Omega-3 Fatty Acids (FISH OIL) 1200 MG CPDR Take by mouth.    . vitamin B-12 (CYANOCOBALAMIN) 1000 MCG tablet Take 1,000 mcg by mouth daily.     No current facility-administered medications on file prior to visit.    Allergies  Allergen Reactions  . Sulfa Antibiotics    Social History   Socioeconomic History  . Marital status: Married    Spouse name: Not on file  . Number of children: Not on file  . Years of education: Not on file  . Highest education level: Not on file  Social Needs  . Financial resource strain: Not on file  . Food insecurity - worry: Not on file  . Food insecurity - inability: Not on file  . Transportation needs - medical: Not on file  . Transportation needs - non-medical: Not on file  Occupational History  . Not on file  Tobacco Use  . Smoking status: Never Smoker  . Smokeless tobacco: Never Used  Substance and Sexual Activity  . Alcohol use:  No  . Drug use: No  . Sexual activity: Not on file  Other Topics Concern  . Not on file  Social History Narrative  . Not on file   No family history on file.    Review of Systems  All other systems reviewed and are negative.      Objective:   Physical Exam  Constitutional: She is oriented to person, place, and time. She appears well-developed and well-nourished. No distress.  HENT:  Head: Normocephalic and atraumatic.  Right Ear: External ear normal.  Left Ear: External ear normal.  Nose: Nose normal.  Mouth/Throat: Oropharynx is clear and moist. No oropharyngeal exudate.  Eyes: Conjunctivae and EOM are normal.  Pupils are equal, round, and reactive to light. Right eye exhibits no discharge. Left eye exhibits no discharge. No scleral icterus.  Neck: Normal range of motion. Neck supple. No JVD present. No thyromegaly present.  Cardiovascular: Normal rate, regular rhythm, normal heart sounds and intact distal pulses. Exam reveals no gallop and no friction rub.  No murmur heard. Pulmonary/Chest: Effort normal and breath sounds normal. No stridor. No respiratory distress. She has no wheezes. She has no rales. She exhibits no tenderness.  Abdominal: Soft. Bowel sounds are normal. She exhibits no distension and no mass. There is no tenderness. There is no rebound and no guarding.  Musculoskeletal: She exhibits edema. She exhibits no tenderness.       Cervical back: She exhibits decreased range of motion and deformity.  Lymphadenopathy:    She has no cervical adenopathy.  Neurological: She is alert and oriented to person, place, and time. She has normal reflexes. No cranial nerve deficit. She exhibits normal muscle tone. Coordination normal.  Skin: Skin is warm. No rash noted. She is not diaphoretic. No erythema. No pallor.  Psychiatric: She has a normal mood and affect. Her behavior is normal. Judgment and thought content normal.  Vitals reviewed.  Patient has pronounced cervical kyphosis. She also has numerous verrucous veins in both legs as well as a large varicosity just superior to the right medial malleolus       Assessment & Plan:  Routine general medical examination at a health care facility  Essential hypertension  Pure hypercholesterolemia Her blood pressure is excellent. I will make no changes in her blood pressure medication. I will check a fasting lipid panel. Her goal LDL cholesterol is less than 100. I will also monitor CBC. I will check a hemoglobin A1c. Her goal hemoglobin A1c is less than 6.5. I recommended 30 minutes a day of aerobic exercise.  I believe she has overactive bladder.   However she declines medication for overactive bladder at the present time.  I believe she has chronic venous insufficiency and I recommended knee-high compression hose.  I also recommended that she take Zantac over-the-counter daily for GI protection given the fact she is chronically using NSAIDs.  Her immunizations are up-to-date. Her cancer screening is up-to-date.

## 2017-06-16 ENCOUNTER — Other Ambulatory Visit: Payer: Self-pay | Admitting: Family Medicine

## 2017-06-16 NOTE — Telephone Encounter (Signed)
Medication refill for one time only.  Patient needs to come for fasting blood work

## 2017-07-14 ENCOUNTER — Other Ambulatory Visit: Payer: Medicare Other

## 2017-07-15 ENCOUNTER — Encounter: Payer: Self-pay | Admitting: Family Medicine

## 2017-07-15 ENCOUNTER — Other Ambulatory Visit: Payer: Self-pay | Admitting: Family Medicine

## 2017-07-15 LAB — COMPLETE METABOLIC PANEL WITH GFR
AG Ratio: 1.9 (calc) (ref 1.0–2.5)
ALBUMIN MSPROF: 4.3 g/dL (ref 3.6–5.1)
ALT: 18 U/L (ref 6–29)
AST: 17 U/L (ref 10–35)
Alkaline phosphatase (APISO): 62 U/L (ref 33–130)
BUN: 18 mg/dL (ref 7–25)
CALCIUM: 9.5 mg/dL (ref 8.6–10.4)
CO2: 29 mmol/L (ref 20–32)
CREATININE: 0.85 mg/dL (ref 0.60–0.93)
Chloride: 102 mmol/L (ref 98–110)
GFR, EST AFRICAN AMERICAN: 79 mL/min/{1.73_m2} (ref >=60)
GFR, EST NON AFRICAN AMERICAN: 68 mL/min/{1.73_m2} (ref >=60)
GLUCOSE: 112 mg/dL — AB (ref 65–99)
Globulin: 2.3 g/dL (calc) (ref 1.9–3.7)
Potassium: 4.5 mmol/L (ref 3.5–5.3)
Sodium: 139 mmol/L (ref 135–146)
TOTAL PROTEIN: 6.6 g/dL (ref 6.1–8.1)
Total Bilirubin: 0.7 mg/dL (ref 0.2–1.2)

## 2017-07-15 LAB — LIPID PANEL
CHOL/HDL RATIO: 4.2 (calc) (ref <5.0)
Cholesterol: 198 mg/dL (ref <200)
HDL: 47 mg/dL — AB (ref >50)
LDL Cholesterol (Calc): 122 mg/dL (calc) — ABNORMAL HIGH
NON-HDL CHOLESTEROL (CALC): 151 mg/dL — AB (ref <130)
TRIGLYCERIDES: 173 mg/dL — AB (ref <150)

## 2017-07-15 LAB — CBC WITH DIFFERENTIAL/PLATELET
BASOS ABS: 27 {cells}/uL (ref 0–200)
Basophils Relative: 0.4 %
EOS PCT: 1.8 %
Eosinophils Absolute: 121 cells/uL (ref 15–500)
HEMATOCRIT: 41.4 % (ref 35.0–45.0)
HEMOGLOBIN: 13.5 g/dL (ref 11.7–15.5)
LYMPHS ABS: 2151 {cells}/uL (ref 850–3900)
MCH: 28.1 pg (ref 27.0–33.0)
MCHC: 32.6 g/dL (ref 32.0–36.0)
MCV: 86.1 fL (ref 80.0–100.0)
MPV: 10.3 fL (ref 7.5–12.5)
Monocytes Relative: 7.7 %
NEUTROS ABS: 3886 {cells}/uL (ref 1500–7800)
NEUTROS PCT: 58 %
Platelets: 210 10*3/uL (ref 140–400)
RBC: 4.81 10*6/uL (ref 3.80–5.10)
RDW: 12.6 % (ref 11.0–15.0)
Total Lymphocyte: 32.1 %
WBC: 6.7 10*3/uL (ref 3.8–10.8)
WBCMIX: 516 {cells}/uL (ref 200–950)

## 2017-07-15 LAB — HEMOGLOBIN A1C
EAG (MMOL/L): 7.1 (calc)
HEMOGLOBIN A1C: 6.1 %{Hb} — AB (ref <5.7)
MEAN PLASMA GLUCOSE: 128 (calc)

## 2017-07-15 MED ORDER — HYDROCHLOROTHIAZIDE 12.5 MG PO CAPS: 12.5000 mg | ORAL_CAPSULE | Freq: Every day | ORAL | 2 refills | Status: DC

## 2017-07-19 ENCOUNTER — Other Ambulatory Visit: Payer: Self-pay | Admitting: Family Medicine

## 2017-08-16 ENCOUNTER — Other Ambulatory Visit: Payer: Self-pay | Admitting: Family Medicine

## 2017-09-25 ENCOUNTER — Other Ambulatory Visit: Payer: Self-pay | Admitting: Family Medicine

## 2018-02-16 ENCOUNTER — Other Ambulatory Visit: Payer: Self-pay | Admitting: Family Medicine

## 2018-02-28 ENCOUNTER — Telehealth: Payer: Self-pay | Admitting: Family Medicine

## 2018-02-28 NOTE — Telephone Encounter (Signed)
Patient has questions about getting some bloodwork done  Please call her at [phone]

## 2018-02-28 NOTE — Telephone Encounter (Signed)
Spoke to pt and questions answered  

## 2018-04-07 ENCOUNTER — Other Ambulatory Visit: Payer: Self-pay | Admitting: Family Medicine

## 2018-04-07 MED ORDER — ATENOLOL 25 MG PO TABS: 25.0000 mg | ORAL_TABLET | Freq: Every day | ORAL | 1 refills | Status: DC

## 2018-04-10 ENCOUNTER — Other Ambulatory Visit: Payer: Self-pay | Admitting: Family Medicine

## 2018-05-11 ENCOUNTER — Other Ambulatory Visit: Payer: Self-pay | Admitting: Family Medicine

## 2018-05-17 ENCOUNTER — Other Ambulatory Visit: Payer: Self-pay | Admitting: Family Medicine

## 2018-05-17 DIAGNOSIS — Z1231 Encounter for screening mammogram for malignant neoplasm of breast: Secondary | ICD-10-CM

## 2018-06-29 ENCOUNTER — Ambulatory Visit
Admission: RE | Admit: 2018-06-29 | Discharge: 2018-06-29 | Disposition: A | Discharge status: Home / Self Care | Payer: Medicare Other | Source: Ambulatory Visit | Attending: Family Medicine | Admitting: Family Medicine

## 2018-06-29 DIAGNOSIS — Z1231 Encounter for screening mammogram for malignant neoplasm of breast: Secondary | ICD-10-CM

## 2018-07-20 ENCOUNTER — Ambulatory Visit
Admission: RE | Admit: 2018-07-20 | Discharge: 2018-07-20 | Disposition: A | Discharge status: Home / Self Care | Payer: Medicare Other | Source: Ambulatory Visit | Attending: Family Medicine | Admitting: Family Medicine

## 2018-07-20 ENCOUNTER — Encounter: Payer: Self-pay | Admitting: Family Medicine

## 2018-07-20 ENCOUNTER — Ambulatory Visit (INDEPENDENT_AMBULATORY_CARE_PROVIDER_SITE_OTHER): Payer: Medicare Other | Admitting: Family Medicine

## 2018-07-20 VITALS — BP 118/76 | HR 77 | Temp 98.2°F | Resp 18 | Ht 63.0 in | Wt 177.0 lb

## 2018-07-20 DIAGNOSIS — R7303 Prediabetes: Secondary | ICD-10-CM

## 2018-07-20 DIAGNOSIS — I10 Essential (primary) hypertension: Secondary | ICD-10-CM

## 2018-07-20 DIAGNOSIS — Z0000 Encounter for general adult medical examination without abnormal findings: Secondary | ICD-10-CM | POA: Diagnosis not present

## 2018-07-20 DIAGNOSIS — E7800 Pure hypercholesterolemia, unspecified: Secondary | ICD-10-CM

## 2018-07-20 DIAGNOSIS — R0609 Other forms of dyspnea: Principal | ICD-10-CM

## 2018-07-20 DIAGNOSIS — Z780 Asymptomatic menopausal state: Secondary | ICD-10-CM

## 2018-07-20 DIAGNOSIS — R0600 Dyspnea, unspecified: Secondary | ICD-10-CM

## 2018-07-20 NOTE — Progress Notes (Signed)
Subjective:    Patient ID: Nichole Bryant, female    DOB: October 21, 1945, 73 y.o.   MRN: [id number]  HPI  Patient is here today for a complete physical exam.  Her mammogram was just performed 06/29/18 and was normal.  She is over the age of 51 and therefore no longer requires Pap smears.  Her colonoscopy was performed approximately 7 years ago.  She states that it was normal and she refuses to have another colonoscopy performed.  However this would still be up-to-date.  Last DEXA was 2012.  Her immunization list is below: Immunization History  Administered Date(s) Administered  . Influenza Split 04/25/2013  . Influenza, High Dose Seasonal PF 05/03/2018  . Influenza,inj,Quad PF,6+ Mos 04/22/2015, 04/06/2016  . Influenza-Unspecified 05/09/2017  . Pneumococcal Conjugate-13 09/22/2015  . Pneumococcal Polysaccharide-23 07/07/2012  . Td 11/01/2000  . Tdap 07/18/2013  . Zoster 02/11/2014  . Zoster Recombinat (Shingrix) 03-Sep-1945, 12/30/2017   Patient denies any depression.  She denies any falls.  She denies any difficulties performing activities of daily living.  However over the last few months, the patient has noticed progressive dyspnea on exertion.  She states that recently she had to walk up a slight incline and she had to stop numerous times to catch her breath.  She sometimes gets winded doing minimal activity.  She denies any chest pain.  She denies any angina.  She has chronic leg swelling secondary to varicose veins.  She denies any use an increase in leg swelling or anasarca.  She denies any orthopnea or paroxysmal nocturnal dyspnea.  She denies any cough or hemoptysis. Past Medical History:  Diagnosis Date  . Diabetes mellitus without complication ([place])   . High cholesterol   . Hypertension    No past surgical history on file. Current Outpatient Medications on File Prior to Visit  Medication Sig Dispense Refill  . atenolol (TENORMIN) 25 MG tablet Take 1 tablet (25 mg total) by  mouth daily. 90 tablet 1  . Calcium Carb-Cholecalciferol (CALCIUM 1000 + D PO) Take by mouth.    . hydrochlorothiazide (MICROZIDE) 12.5 MG capsule TAKE 1 CAPSULE BY MOUTH EVERY DAY 90 capsule 2  . lovastatin (MEVACOR) 20 MG tablet TAKE 1 TABLET BY MOUTH EVERY DAY 90 tablet 0  . meloxicam (MOBIC) 7.5 MG tablet Take 7.5 mg by mouth daily.  3  . Omega-3 Fatty Acids (FISH OIL) 1200 MG CPDR Take by mouth.    . vitamin B-12 (CYANOCOBALAMIN) 1000 MCG tablet Take 1,000 mcg by mouth daily.     No current facility-administered medications on file prior to visit.    Allergies  Allergen Reactions  . Sulfa Antibiotics    Social History   Socioeconomic History  . Marital status: Married    Spouse name: Not on file  . Number of children: Not on file  . Years of education: Not on file  . Highest education level: Not on file  Occupational History  . Not on file  Social Needs  . Financial resource strain: Not on file  . Food insecurity:    Worry: Not on file    Inability: Not on file  . Transportation needs:    Medical: Not on file    Non-medical: Not on file  Tobacco Use  . Smoking status: Never Smoker  . Smokeless tobacco: Never Used  Substance and Sexual Activity  . Alcohol use: No  . Drug use: No  . Sexual activity: Not on file  Lifestyle  . Physical  activity:    Days per week: Not on file    Minutes per session: Not on file  . Stress: Not on file  Relationships  . Social connections:    Talks on phone: Not on file    Gets together: Not on file    Attends religious service: Not on file    Active member of club or organization: Not on file    Attends meetings of clubs or organizations: Not on file    Relationship status: Not on file  . Intimate partner violence:    Fear of current or ex partner: Not on file    Emotionally abused: Not on file    Physically abused: Not on file    Forced sexual activity: Not on file  Other Topics Concern  . Not on file  Social History  Narrative  . Not on file   No family history on file.    Review of Systems  All other systems reviewed and are negative.      Objective:   Physical Exam  Constitutional: She is oriented to person, place, and time. She appears well-developed and well-nourished. No distress.  HENT:  Head: Normocephalic and atraumatic.  Right Ear: External ear normal.  Left Ear: External ear normal.  Nose: Nose normal.  Mouth/Throat: Oropharynx is clear and moist. No oropharyngeal exudate.  Eyes: Pupils are equal, round, and reactive to light. Conjunctivae and EOM are normal. Right eye exhibits no discharge. Left eye exhibits no discharge. No scleral icterus.  Neck: Normal range of motion. Neck supple. No JVD present. No thyromegaly present.  Cardiovascular: Normal rate, regular rhythm, normal heart sounds and intact distal pulses. Exam reveals no gallop and no friction rub.  No murmur heard. Pulmonary/Chest: Effort normal and breath sounds normal. No stridor. No respiratory distress. She has no wheezes. She has no rales. She exhibits no tenderness.  Abdominal: Soft. Bowel sounds are normal. She exhibits no distension and no mass. There is no abdominal tenderness. There is no rebound and no guarding.  Musculoskeletal:        General: Edema present. No tenderness.     Cervical back: She exhibits decreased range of motion and deformity.  Lymphadenopathy:    She has no cervical adenopathy.  Neurological: She is alert and oriented to person, place, and time. She has normal reflexes. No cranial nerve deficit. She exhibits normal muscle tone. Coordination normal.  Skin: Skin is warm. No rash noted. She is not diaphoretic. No erythema. No pallor.  Psychiatric: She has a normal mood and affect. Her behavior is normal. Judgment and thought content normal.  Vitals reviewed.  Patient has pronounced cervical kyphosis. She also has numerous verrucous veins in both legs as well as a large varicosity just superior  to the right medial malleolus       Assessment & Plan:  Routine general medical examination at a health care facility  Essential hypertension - Plan: Hemoglobin A1c, CBC with Differential/Platelet, COMPLETE METABOLIC PANEL WITH GFR, Lipid panel  Pure hypercholesterolemia - Plan: Hemoglobin A1c, CBC with Differential/Platelet, COMPLETE METABOLIC PANEL WITH GFR, Lipid panel  Prediabetes - Plan: Hemoglobin A1c, CBC with Differential/Platelet, COMPLETE METABOLIC PANEL WITH GFR, Lipid panel  Dyspnea on exertion - Plan: DG Chest 2 View, ECHOCARDIOGRAM COMPLETE  Postmenopausal estrogen deficiency - Plan: DG Bone Density  Patient's physical exam today is significant for kyphosis, varicose veins with pitting edema in her legs, and obesity.  Her blood pressure is excellent at 118/76.  Her mammogram is  up-to-date.  Her colonoscopy is up-to-date.  She does not require Pap smear.  I will schedule the patient for a bone density test.  She denies any problems with falls, depression, or memory issues.  Guarding her prediabetes, I will check a hemoglobin A1c.  I would also check a CBC, CMP, and fasting lipid panel.  I will schedule the patient for repeat bone density test to evaluate for osteoporosis given the fact she has not had one in a years.  Given her dyspnea on exertion, I suspect most likely due to deconditioning however I will check a chest x-ray and also schedule the patient for an echocardiogram.

## 2018-07-21 ENCOUNTER — Other Ambulatory Visit: Payer: Medicare Other

## 2018-07-21 ENCOUNTER — Telehealth: Payer: Self-pay | Admitting: Family Medicine

## 2018-07-21 MED ORDER — MELOXICAM 7.5 MG PO TABS: 7.5000 mg | ORAL_TABLET | Freq: Every day | ORAL | 3 refills | Status: DC

## 2018-07-21 NOTE — Telephone Encounter (Signed)
Pt wants mobic to [facility]

## 2018-07-21 NOTE — Telephone Encounter (Signed)
Medication called/sent to requested pharmacy  

## 2018-07-22 LAB — LIPID PANEL
Cholesterol: 190 mg/dL (ref <200)
HDL: 53 mg/dL (ref >50)
LDL Cholesterol (Calc): 113 mg/dL (calc) — ABNORMAL HIGH
NON-HDL CHOLESTEROL (CALC): 137 mg/dL — AB (ref <130)
TRIGLYCERIDES: 126 mg/dL (ref <150)
Total CHOL/HDL Ratio: 3.6 (calc) (ref <5.0)

## 2018-07-22 LAB — CBC WITH DIFFERENTIAL/PLATELET
ABSOLUTE MONOCYTES: 616 {cells}/uL (ref 200–950)
BASOS ABS: 23 {cells}/uL (ref 0–200)
Basophils Relative: 0.3 %
EOS PCT: 1.7 %
Eosinophils Absolute: 129 cells/uL (ref 15–500)
HEMATOCRIT: 41.6 % (ref 35.0–45.0)
Hemoglobin: 13.7 g/dL (ref 11.7–15.5)
LYMPHS ABS: 1771 {cells}/uL (ref 850–3900)
MCH: 28.2 pg (ref 27.0–33.0)
MCHC: 32.9 g/dL (ref 32.0–36.0)
MCV: 85.8 fL (ref 80.0–100.0)
MPV: 10.9 fL (ref 7.5–12.5)
Monocytes Relative: 8.1 %
NEUTROS PCT: 66.6 %
Neutro Abs: 5062 cells/uL (ref 1500–7800)
Platelets: 193 10*3/uL (ref 140–400)
RBC: 4.85 10*6/uL (ref 3.80–5.10)
RDW: 12.3 % (ref 11.0–15.0)
TOTAL LYMPHOCYTE: 23.3 %
WBC: 7.6 10*3/uL (ref 3.8–10.8)

## 2018-07-22 LAB — COMPLETE METABOLIC PANEL WITH GFR
AG Ratio: 1.9 (calc) (ref 1.0–2.5)
ALBUMIN MSPROF: 4.2 g/dL (ref 3.6–5.1)
ALT: 16 U/L (ref 6–29)
AST: 16 U/L (ref 10–35)
Alkaline phosphatase (APISO): 62 U/L (ref 33–130)
BUN: 25 mg/dL (ref 7–25)
CALCIUM: 9.4 mg/dL (ref 8.6–10.4)
CO2: 28 mmol/L (ref 20–32)
CREATININE: 0.87 mg/dL (ref 0.60–0.93)
Chloride: 101 mmol/L (ref 98–110)
GFR, EST NON AFRICAN AMERICAN: 66 mL/min/{1.73_m2} (ref >=60)
GFR, Est African American: 77 mL/min/{1.73_m2} (ref >=60)
GLOBULIN: 2.2 g/dL (ref 1.9–3.7)
Glucose, Bld: 113 mg/dL — ABNORMAL HIGH (ref 65–99)
Potassium: 4.5 mmol/L (ref 3.5–5.3)
SODIUM: 139 mmol/L (ref 135–146)
Total Bilirubin: 0.6 mg/dL (ref 0.2–1.2)
Total Protein: 6.4 g/dL (ref 6.1–8.1)

## 2018-07-22 LAB — HEMOGLOBIN A1C
HEMOGLOBIN A1C: 6.2 %{Hb} — AB (ref <5.7)
Mean Plasma Glucose: 131 (calc)
eAG (mmol/L): 7.3 (calc)

## 2018-07-25 ENCOUNTER — Ambulatory Visit (HOSPITAL_COMMUNITY): Payer: Medicare Other | Attending: Cardiovascular Disease

## 2018-07-25 ENCOUNTER — Other Ambulatory Visit: Payer: Self-pay

## 2018-07-25 DIAGNOSIS — R0609 Other forms of dyspnea: Secondary | ICD-10-CM

## 2018-07-25 DIAGNOSIS — R0600 Dyspnea, unspecified: Secondary | ICD-10-CM

## 2018-08-06 ENCOUNTER — Other Ambulatory Visit: Payer: Self-pay | Admitting: Family Medicine

## 2018-08-22 ENCOUNTER — Other Ambulatory Visit: Payer: Self-pay

## 2018-08-24 ENCOUNTER — Other Ambulatory Visit: Payer: Self-pay

## 2018-08-31 ENCOUNTER — Ambulatory Visit
Admission: RE | Admit: 2018-08-31 | Discharge: 2018-08-31 | Disposition: A | Discharge status: Home / Self Care | Payer: Medicare Other | Source: Ambulatory Visit | Attending: Family Medicine | Admitting: Family Medicine

## 2018-08-31 DIAGNOSIS — Z780 Asymptomatic menopausal state: Secondary | ICD-10-CM

## 2018-10-09 ENCOUNTER — Other Ambulatory Visit: Payer: Self-pay

## 2018-10-09 MED ORDER — ATENOLOL 25 MG PO TABS: 25.0000 mg | ORAL_TABLET | Freq: Every day | ORAL | 1 refills | Status: DC

## 2018-12-02 ENCOUNTER — Other Ambulatory Visit: Payer: Self-pay | Admitting: Family Medicine

## 2018-12-15 ENCOUNTER — Telehealth: Payer: Self-pay | Admitting: Family Medicine

## 2018-12-15 NOTE — Telephone Encounter (Signed)
Pt called stating that she has yeast under her breast and was wondering if we could call her in something for it?

## 2018-12-18 ENCOUNTER — Other Ambulatory Visit: Payer: Self-pay | Admitting: Family Medicine

## 2018-12-18 MED ORDER — CLOTRIMAZOLE-BETAMETHASONE 1-0.05 % EX CREA: 1.0000 "application " | TOPICAL_CREAM | Freq: Two times a day (BID) | CUTANEOUS | 0 refills | Status: DC

## 2018-12-18 NOTE — Telephone Encounter (Signed)
Pt aware.

## 2018-12-18 NOTE — Telephone Encounter (Signed)
I sent lotrisone to pharmacy.

## 2019-01-12 ENCOUNTER — Other Ambulatory Visit: Payer: Self-pay | Admitting: *Deleted

## 2019-01-12 MED ORDER — HYDROCHLOROTHIAZIDE 12.5 MG PO CAPS: 12.5000 mg | ORAL_CAPSULE | Freq: Every day | ORAL | 1 refills | Status: DC

## 2019-06-05 ENCOUNTER — Other Ambulatory Visit: Payer: Self-pay | Admitting: Family Medicine

## 2019-06-05 DIAGNOSIS — Z1231 Encounter for screening mammogram for malignant neoplasm of breast: Secondary | ICD-10-CM

## 2019-06-07 ENCOUNTER — Other Ambulatory Visit: Payer: Self-pay | Admitting: Family Medicine

## 2019-06-26 ENCOUNTER — Ambulatory Visit: Payer: Medicare Other | Attending: Internal Medicine

## 2019-06-26 DIAGNOSIS — Z20822 Contact with and (suspected) exposure to covid-19: Secondary | ICD-10-CM

## 2019-06-28 ENCOUNTER — Other Ambulatory Visit: Payer: Self-pay | Admitting: Nurse Practitioner

## 2019-06-28 ENCOUNTER — Telehealth: Payer: Self-pay | Admitting: Nurse Practitioner

## 2019-06-28 DIAGNOSIS — U071 COVID-19: Secondary | ICD-10-CM

## 2019-06-28 DIAGNOSIS — I10 Essential (primary) hypertension: Secondary | ICD-10-CM

## 2019-06-28 LAB — NOVEL CORONAVIRUS, NAA: SARS-CoV-2, NAA: DETECTED — AB

## 2019-06-28 NOTE — Telephone Encounter (Signed)
  I connected by phone with Nichole Bryant on 06/28/2019 at 12:47 PM to discuss the potential use of an new treatment for mild to moderate COVID-19 viral infection in non-hospitalized patients.  Symptoms started on 06/25/2019, putting her at day 7 on 07/02/2019 infusion date.  She continues to have symptoms to include fatigue and URI symptoms.  This patient is a 73 y.o. female that meets the FDA criteria for Emergency Use Authorization of bamlanivimab or casirivimab\imdevimab.  Has a (+) direct SARS-CoV-2 viral test result  Has mild or moderate COVID-19   Is ? 73 years of age and weighs ? 40 kg  Is NOT hospitalized due to COVID-19  Is NOT requiring oxygen therapy or requiring an increase in baseline oxygen flow rate due to COVID-19  Is within 10 days of symptom onset  Has at least one of the high risk factor(s) for progression to severe COVID-19 and/or hospitalization as defined in EUA.  Specific high risk criteria : >/= 73 yo   I have spoken and communicated the following to the patient or parent/caregiver:  1. FDA has authorized the emergency use of bamlanivimab and casirivimab\imdevimab for the treatment of mild to moderate COVID-19 in adults and pediatric patients with positive results of direct SARS-CoV-2 viral testing who are 103 years of age and older weighing at least 40 kg, and who are at high risk for progressing to severe COVID-19 and/or hospitalization.  2. The significant known and potential risks and benefits of bamlanivimab and casirivimab\imdevimab, and the extent to which such potential risks and benefits are unknown.  3. Information on available alternative treatments and the risks and benefits of those alternatives, including clinical trials.  4. Patients treated with bamlanivimab and casirivimab\imdevimab should continue to self-isolate and use infection control measures (e.g., wear mask, isolate, social distance, avoid sharing personal items, clean and disinfect  "high touch" surfaces, and frequent handwashing) according to CDC guidelines.   5. The patient or parent/caregiver has the option to accept or refuse bamlanivimab or casirivimab\imdevimab .  After reviewing this information with the patient, The patient agreed to proceed with receiving the bamlanimivab infusion and will be provided a copy of the Fact sheet prior to receiving the infusion.Venita Lick 06/28/2019 12:47 PM

## 2019-06-28 NOTE — Progress Notes (Signed)
Monoclonal antibody orders.

## 2019-06-28 NOTE — Telephone Encounter (Signed)
Scheduled for 1PM Monday 12/28

## 2019-07-02 ENCOUNTER — Ambulatory Visit (HOSPITAL_COMMUNITY)
Admission: RE | Admit: 2019-07-02 | Discharge: 2019-07-02 | Disposition: A | Discharge status: Home / Self Care | Payer: Medicare Other | Source: Ambulatory Visit | Attending: Pulmonary Disease | Admitting: Pulmonary Disease

## 2019-07-02 DIAGNOSIS — U071 COVID-19: Secondary | ICD-10-CM | POA: Insufficient documentation

## 2019-07-02 DIAGNOSIS — I10 Essential (primary) hypertension: Secondary | ICD-10-CM | POA: Diagnosis not present

## 2019-07-02 DIAGNOSIS — Z23 Encounter for immunization: Secondary | ICD-10-CM | POA: Insufficient documentation

## 2019-07-02 MED ORDER — SODIUM CHLORIDE 0.9 % IV SOLN
700.0000 mg | Freq: Once | INTRAVENOUS | Status: AC
  Administered 2019-07-02: 700 mg via INTRAVENOUS
  Filled 2019-07-02: qty 20

## 2019-07-02 MED ORDER — METHYLPREDNISOLONE SODIUM SUCC 125 MG IJ SOLR: 125.0000 mg | Freq: Once | INTRAMUSCULAR | Status: DC | PRN

## 2019-07-02 MED ORDER — EPINEPHRINE 0.3 MG/0.3ML IJ SOAJ: 0.3000 mg | Freq: Once | INTRAMUSCULAR | Status: DC | PRN

## 2019-07-02 MED ORDER — DIPHENHYDRAMINE HCL 50 MG/ML IJ SOLN: 50.0000 mg | Freq: Once | INTRAMUSCULAR | Status: DC | PRN

## 2019-07-02 MED ORDER — FAMOTIDINE IN NACL 20-0.9 MG/50ML-% IV SOLN: 20.0000 mg | Freq: Once | INTRAVENOUS | Status: DC | PRN

## 2019-07-02 MED ORDER — ALBUTEROL SULFATE HFA 108 (90 BASE) MCG/ACT IN AERS: 2.0000 | INHALATION_SPRAY | Freq: Once | RESPIRATORY_TRACT | Status: DC | PRN

## 2019-07-02 MED ORDER — SODIUM CHLORIDE 0.9 % IV SOLN
INTRAVENOUS | Status: DC | PRN
  Administered 2019-07-02: 250 mL via INTRAVENOUS

## 2019-07-02 NOTE — Progress Notes (Signed)
  Diagnosis: COVID-19  Physician: Dr. Joya Gaskins  Procedure: [facility] Med: bamlanivimab infusion - Provided patient with bamlanimivab fact sheet for patients, parents and caregivers prior to infusion.  Complications: No immediate complications noted.  Discharge: Discharged home   Janine Ores 07/02/2019

## 2019-07-02 NOTE — Discharge Instructions (Signed)
COVID-19 °COVID-19 is a respiratory infection that is caused by a virus called severe acute respiratory syndrome coronavirus 2 (SARS-CoV-2). The disease is also known as coronavirus disease or novel coronavirus. In some people, the virus may not cause any symptoms. In others, it may cause a serious infection. The infection can get worse quickly and can lead to complications, such as: °· Pneumonia, or infection of the lungs. °· Acute respiratory distress syndrome or ARDS. This is fluid build-up in the lungs. °· Acute respiratory failure. This is a condition in which there is not enough oxygen passing from the lungs to the body. °· Sepsis or septic shock. This is a serious bodily reaction to an infection. °· Blood clotting problems. °· Secondary infections due to bacteria or fungus. °The virus that causes COVID-19 is contagious. This means that it can spread from person to person through droplets from coughs and sneezes (respiratory secretions). °What are the causes? °This illness is caused by a virus. You may catch the virus by: °· Breathing in droplets from an infected person's cough or sneeze. °· Touching something, like a table or a doorknob, that was exposed to the virus (contaminated) and then touching your mouth, nose, or eyes. °What increases the risk? °Risk for infection °You are more likely to be infected with this virus if you: °· Live in or travel to an area with a COVID-19 outbreak. °· Come in contact with a sick person who recently traveled to an area with a COVID-19 outbreak. °· Provide care for or live with a person who is infected with COVID-19. °Risk for serious illness °You are more likely to become seriously ill from the virus if you: °· Are 65 years of age or older. °· Have a long-term disease that lowers your body's ability to fight infection (immunocompromised). °· Live in a nursing home or long-term care facility. °· Have a long-term (chronic) disease such as: °? Chronic lung disease, including  chronic obstructive pulmonary disease or asthma °? Heart disease. °? Diabetes. °? Chronic kidney disease. °? Liver disease. °· Are obese. °What are the signs or symptoms? °Symptoms of this condition can range from mild to severe. Symptoms may appear any time from 2 to 14 days after being exposed to the virus. They include: °· A fever. °· A cough. °· Difficulty breathing. °· Chills. °· Muscle pains. °· A sore throat. °· Loss of taste or smell. °Some people may also have stomach problems, such as nausea, vomiting, or diarrhea. °Other people may not have any symptoms of COVID-19. °How is this diagnosed? °This condition may be diagnosed based on: °· Your signs and symptoms, especially if: °? You live in an area with a COVID-19 outbreak. °? You recently traveled to or from an area where the virus is common. °? You provide care for or live with a person who was diagnosed with COVID-19. °· A physical exam. °· Lab tests, which may include: °? A nasal swab to take a sample of fluid from your nose. °? A throat swab to take a sample of fluid from your throat. °? A sample of mucus from your lungs (sputum). °? Blood tests. °· Imaging tests, which may include, X-rays, CT scan, or ultrasound. °How is this treated? °At present, there is no medicine to treat COVID-19. Medicines that treat other diseases are being used on a trial basis to see if they are effective against COVID-19. °Your health care provider will talk with you about ways to treat your symptoms. For most   people, the infection is mild and can be managed at home with rest, fluids, and over-the-counter medicines. °Treatment for a serious infection usually takes places in a hospital intensive care unit (ICU). It may include one or more of the following treatments. These treatments are given until your symptoms improve. °· Receiving fluids and medicines through an IV. °· Supplemental oxygen. Extra oxygen is given through a tube in the nose, a face mask, or a  hood. °· Positioning you to lie on your stomach (prone position). This makes it easier for oxygen to get into the lungs. °· Continuous positive airway pressure (CPAP) or bi-level positive airway pressure (BPAP) machine. This treatment uses mild air pressure to keep the airways open. A tube that is connected to a motor delivers oxygen to the body. °· Ventilator. This treatment moves air into and out of the lungs by using a tube that is placed in your windpipe. °· Tracheostomy. This is a procedure to create a hole in the neck so that a breathing tube can be inserted. °· Extracorporeal membrane oxygenation (ECMO). This procedure gives the lungs a chance to recover by taking over the functions of the heart and lungs. It supplies oxygen to the body and removes carbon dioxide. °Follow these instructions at home: °Lifestyle °· If you are sick, stay home except to get medical care. Your health care provider will tell you how long to stay home. Call your health care provider before you go for medical care. °· Rest at home as told by your health care provider. °· Do not use any products that contain nicotine or tobacco, such as cigarettes, e-cigarettes, and chewing tobacco. If you need help quitting, ask your health care provider. °· Return to your normal activities as told by your health care provider. Ask your health care provider what activities are safe for you. °General instructions °· Take over-the-counter and prescription medicines only as told by your health care provider. °· Drink enough fluid to keep your urine pale yellow. °· Keep all follow-up visits as told by your health care provider. This is important. °How is this prevented? ° °There is no vaccine to help prevent COVID-19 infection. However, there are steps you can take to protect yourself and others from this virus. °To protect yourself:  °· Do not travel to areas where COVID-19 is a risk. The areas where COVID-19 is reported change often. To identify  high-risk areas and travel restrictions, check the CDC travel website: wwwnc.cdc.gov/travel/notices °· If you live in, or must travel to, an area where COVID-19 is a risk, take precautions to avoid infection. °? Stay away from people who are sick. °? Wash your hands often with soap and water for 20 seconds. If soap and water are not available, use an alcohol-based hand sanitizer. °? Avoid touching your mouth, face, eyes, or nose. °? Avoid going out in public, follow guidance from your state and local health authorities. °? If you must go out in public, wear a cloth face covering or face mask. °? Disinfect objects and surfaces that are frequently touched every day. This may include: °§ Counters and tables. °§ Doorknobs and light switches. °§ Sinks and faucets. °§ Electronics, such as phones, remote controls, keyboards, computers, and tablets. °To protect others: °If you have symptoms of COVID-19, take steps to prevent the virus from spreading to others. °· If you think you have a COVID-19 infection, contact your health care provider right away. Tell your health care team that you think you may   have a COVID-19 infection. °· Stay home. Leave your house only to seek medical care. Do not use public transport. °· Do not travel while you are sick. °· Wash your hands often with soap and water for 20 seconds. If soap and water are not available, use alcohol-based hand sanitizer. °· Stay away from other members of your household. Let healthy household members care for children and pets, if possible. If you have to care for children or pets, wash your hands often and wear a mask. If possible, stay in your own room, separate from others. Use a different bathroom. °· Make sure that all people in your household wash their hands well and often. °· Cough or sneeze into a tissue or your sleeve or elbow. Do not cough or sneeze into your hand or into the air. °· Wear a cloth face covering or face mask. °Where to find more  information °· Centers for Disease Control and Prevention: www.cdc.gov/coronavirus/2019-ncov/index.html °· World Health Organization: www.who.int/health-topics/coronavirus °Contact a health care provider if: °· You live in or have traveled to an area where COVID-19 is a risk and you have symptoms of the infection. °· You have had contact with someone who has COVID-19 and you have symptoms of the infection. °Get help right away if: °· You have trouble breathing. °· You have pain or pressure in your chest. °· You have confusion. °· You have bluish lips and fingernails. °· You have difficulty waking from sleep. °· You have symptoms that get worse. °These symptoms may represent a serious problem that is an emergency. Do not wait to see if the symptoms will go away. Get medical help right away. Call your local emergency services (911 in the [place] Do not drive yourself to the hospital. Let the emergency medical personnel know if you think you have COVID-19. °Summary °· COVID-19 is a respiratory infection that is caused by a virus. It is also known as coronavirus disease or novel coronavirus. It can cause serious infections, such as pneumonia, acute respiratory distress syndrome, acute respiratory failure, or sepsis. °· The virus that causes COVID-19 is contagious. This means that it can spread from person to person through droplets from coughs and sneezes. °· You are more likely to develop a serious illness if you are 65 years of age or older, have a weak immunity, live in a nursing home, or have chronic disease. °· There is no medicine to treat COVID-19. Your health care provider will talk with you about ways to treat your symptoms. °· Take steps to protect yourself and others from infection. Wash your hands often and disinfect objects and surfaces that are frequently touched every day. Stay away from people who are sick and wear a mask if you are sick. °This information is not intended to replace advice given to you by  your health care provider. Make sure you discuss any questions you have with your health care provider. °Document Released: 07/27/2018 Document Revised: 11/16/2018 Document Reviewed: 07/27/2018 °Elsevier Patient Education © 2020 [employer] ° °COVID-19: How to Protect Yourself and Others °Know how it spreads °· There is currently no vaccine to prevent coronavirus disease 2019 (COVID-19). °· The best way to prevent illness is to avoid being exposed to this virus. °· The virus is thought to spread mainly from person-to-person. °? Between people who are in close contact with one another (within about 6 feet). °? Through respiratory droplets produced when an infected person coughs, sneezes or talks. °? These droplets can land in   the mouths or noses of people who are nearby or possibly be inhaled into the lungs. °? Some recent studies have suggested that COVID-19 may be spread by people who are not showing symptoms. °Everyone should °Clean your hands often °· Wash your hands often with soap and water for at least 20 seconds especially after you have been in a public place, or after blowing your nose, coughing, or sneezing. °· If soap and water are not readily available, use a hand sanitizer that contains at least 60% alcohol. Cover all surfaces of your hands and rub them together until they feel dry. °· Avoid touching your eyes, nose, and mouth with unwashed hands. °Avoid close contact °· Stay home if you are sick. °· Avoid close contact with people who are sick. °· Put distance between yourself and other people. °? Remember that some people without symptoms may be able to spread virus. °? This is especially important for people who are at higher risk of getting very sick.www.cdc.gov/coronavirus/2019-ncov/need-extra-precautions/people-at-higher-risk.html °Cover your mouth and nose with a cloth face cover when around others °· You could spread COVID-19 to others even if you do not feel sick. °· Everyone should wear a  cloth face cover when they have to go out in public, for example to the grocery store or to pick up other necessities. °? Cloth face coverings should not be placed on young children under age 2, anyone who has trouble breathing, or is unconscious, incapacitated or otherwise unable to remove the mask without assistance. °· The cloth face cover is meant to protect other people in case you are infected. °· Do NOT use a facemask meant for a healthcare worker. °· Continue to keep about 6 feet between yourself and others. The cloth face cover is not a substitute for social distancing. °Cover coughs and sneezes °· If you are in a private setting and do not have on your cloth face covering, remember to always cover your mouth and nose with a tissue when you cough or sneeze or use the inside of your elbow. °· Throw used tissues in the trash. °· Immediately wash your hands with soap and water for at least 20 seconds. If soap and water are not readily available, clean your hands with a hand sanitizer that contains at least 60% alcohol. °Clean and disinfect °· Clean AND disinfect frequently touched surfaces daily. This includes tables, doorknobs, light switches, countertops, handles, desks, phones, keyboards, toilets, faucets, and sinks. www.cdc.gov/coronavirus/2019-ncov/prevent-getting-sick/disinfecting-your-home.html °· If surfaces are dirty, clean them: Use detergent or soap and water prior to disinfection. °· Then, use a household disinfectant. You can see a list of EPA-registered household disinfectants here. °cdc.gov/coronavirus °11/07/2018 °This information is not intended to replace advice given to you by your health care provider. Make sure you discuss any questions you have with your health care provider. °Document Released: 10/17/2018 Document Revised: 11/15/2018 Document Reviewed: 10/17/2018 °Elsevier Patient Education © 2020 [employer] ° ° °COVID-19 Frequently Asked Questions °COVID-19 (coronavirus disease) is  an infection that is caused by a large family of viruses. Some viruses cause illness in people and others cause illness in animals like camels, cats, and bats. In some cases, the viruses that cause illness in animals can spread to humans. °Where did the coronavirus come from? °In December 2019, [place] told the World Health Organization (WHO) of several cases of lung disease (human respiratory illness). These cases were linked to an open seafood and livestock market in the city of [place]. The link to the seafood and   livestock market suggests that the virus may have spread from animals to humans. However, since that first outbreak in December, the virus has also been shown to spread from person to person. °What is the name of the disease and the virus? °Disease name °Early on, this disease was called novel coronavirus. This is because scientists determined that the disease was caused by a new (novel) respiratory virus. The World Health Organization (WHO) has now named the disease COVID-19, or coronavirus disease. °Virus name °The virus that causes the disease is called severe acute respiratory syndrome coronavirus 2 (SARS-CoV-2). °More information on disease and virus naming °World Health Organization (WHO): www.who.int/emergencies/diseases/novel-coronavirus-2019/technical-guidance/naming-the-coronavirus-disease-(covid-2019)-and-the-virus-that-causes-it °Who is at risk for complications from coronavirus disease? °Some people may be at higher risk for complications from coronavirus disease. This includes older adults and people who have chronic diseases, such as heart disease, diabetes, and lung disease. °If you are at higher risk for complications, take these extra precautions: °· Avoid close contact with people who are sick or have a fever or cough. Stay at least 3-6 ft (1-2 m) away from them, if possible. °· Wash your hands often with soap and water for at least 20 seconds. °· Avoid touching your face, mouth, nose, or  eyes. °· Keep supplies on hand at home, such as food, medicine, and cleaning supplies. °· Stay home as much as possible. °· Avoid social gatherings and travel. °How does coronavirus disease spread? °The virus that causes coronavirus disease spreads easily from person to person (is contagious). There are also cases of community-spread disease. This means the disease has spread to: °· People who have no known contact with other infected people. °· People who have not traveled to areas where there are known cases. °It appears to spread from one person to another through droplets from coughing or sneezing. °Can I get the virus from touching surfaces or objects? °There is still a lot that we do not know about the virus that causes coronavirus disease. Scientists are basing a lot of information on what they know about similar viruses, such as: °· Viruses cannot generally survive on surfaces for long. They need a human body (host) to survive. °· It is more likely that the virus is spread by close contact with people who are sick (direct contact), such as through: °? Shaking hands or hugging. °? Breathing in respiratory droplets that travel through the air. This can happen when an infected person coughs or sneezes on or near other people. °· It is less likely that the virus is spread when a person touches a surface or object that has the virus on it (indirect contact). The virus may be able to enter the body if the person touches a surface or object and then touches his or her face, eyes, nose, or mouth. °Can a person spread the virus without having symptoms of the disease? °It may be possible for the virus to spread before a person has symptoms of the disease, but this is most likely not the main way the virus is spreading. It is more likely for the virus to spread by being in close contact with people who are sick and breathing in the respiratory droplets of a sick person's cough or sneeze. °What are the symptoms of  coronavirus disease? °Symptoms vary from person to person and can range from mild to severe. Symptoms may include: °· Fever. °· Cough. °· Tiredness, weakness, or fatigue. °· Fast breathing or feeling short of breath. °These symptoms can appear anywhere from   2 to 14 days after you have been exposed to the virus. If you develop symptoms, call your health care provider. People with severe symptoms may need hospital care. °If I am exposed to the virus, how long does it take before symptoms start? °Symptoms of coronavirus disease may appear anywhere from 2 to 14 days after a person has been exposed to the virus. If you develop symptoms, call your health care provider. °Should I be tested for this virus? °Your health care provider will decide whether to test you based on your symptoms, history of exposure, and your risk factors. °How does a health care provider test for this virus? °Health care providers will collect samples to send for testing. Samples may include: °· Taking a swab of fluid from the nose. °· Taking fluid from the lungs by having you cough up mucus (sputum) into a sterile cup. °· Taking a blood sample. °· Taking a stool or urine sample. °Is there a treatment or vaccine for this virus? °Currently, there is no vaccine to prevent coronavirus disease. Also, there are no medicines like antibiotics or antivirals to treat the virus. A person who becomes sick is given supportive care, which means rest and fluids. A person may also relieve his or her symptoms by using over-the-counter medicines that treat sneezing, coughing, and runny nose. These are the same medicines that a person takes for the common cold. °If you develop symptoms, call your health care provider. People with severe symptoms may need hospital care. °What can I do to protect myself and my family from this virus? ° °  ° °You can protect yourself and your family by taking the same actions that you would take to prevent the spread of other viruses.  Take the following actions: °· Wash your hands often with soap and water for at least 20 seconds. If soap and water are not available, use alcohol-based hand sanitizer. °· Avoid touching your face, mouth, nose, or eyes. °· Cough or sneeze into a tissue, sleeve, or elbow. Do not cough or sneeze into your hand or the air. °? If you cough or sneeze into a tissue, throw it away immediately and wash your hands. °· Disinfect objects and surfaces that you frequently touch every day. °· Avoid close contact with people who are sick or have a fever or cough. Stay at least 3-6 ft (1-2 m) away from them, if possible. °· Stay home if you are sick, except to get medical care. Call your health care provider before you get medical care. °· Make sure your vaccines are up to date. Ask your health care provider what vaccines you need. °What should I do if I need to travel? °Follow travel recommendations from your local health authority, the CDC, and WHO. °Travel information and advice °· Centers for Disease Control and Prevention (CDC): www.cdc.gov/coronavirus/2019-ncov/travelers/index.html °· World Health Organization (WHO): www.who.int/emergencies/diseases/novel-coronavirus-2019/travel-advice °Know the risks and take action to protect your health °· You are at higher risk of getting coronavirus disease if you are traveling to areas with an outbreak or if you are exposed to travelers from areas with an outbreak. °· Wash your hands often and practice good hygiene to lower the risk of catching or spreading the virus. °What should I do if I am sick? °General instructions to stop the spread of infection °· Wash your hands often with soap and water for at least 20 seconds. If soap and water are not available, use alcohol-based hand sanitizer. °· Cough or sneeze into a   tissue, sleeve, or elbow. Do not cough or sneeze into your hand or the air. °· If you cough or sneeze into a tissue, throw it away immediately and wash your hands. °· Stay  home unless you must get medical care. Call your health care provider or local health authority before you get medical care. °· Avoid public areas. Do not take public transportation, if possible. °· If you can, wear a mask if you must go out of the house or if you are in close contact with someone who is not sick. °Keep your home clean °· Disinfect objects and surfaces that are frequently touched every day. This may include: °? Counters and tables. °? Doorknobs and light switches. °? Sinks and faucets. °? Electronics such as phones, remote controls, keyboards, computers, and tablets. °· Wash dishes in hot, soapy water or use a dishwasher. Air-dry your dishes. °· Wash laundry in hot water. °Prevent infecting other household members °· Let healthy household members care for children and pets, if possible. If you have to care for children or pets, wash your hands often and wear a mask. °· Sleep in a different bedroom or bed, if possible. °· Do not share personal items, such as razors, toothbrushes, deodorant, combs, brushes, towels, and washcloths. °Where to find more information °Centers for Disease Control and Prevention (CDC) °· Information and news updates: www.cdc.gov/coronavirus/2019-ncov °World Health Organization (WHO) °· Information and news updates: www.who.int/emergencies/diseases/novel-coronavirus-2019 °· Coronavirus health topic: www.who.int/health-topics/coronavirus °· Questions and answers on COVID-19: www.who.int/news-room/q-a-detail/q-a-coronaviruses °· Global tracker: who.sprinklr.com °American Academy of Pediatrics (AAP) °· Information for families: www.healthychildren.org/English/health-issues/conditions/chest-lungs/Pages/2019-Novel-Coronavirus.aspx °The coronavirus situation is changing rapidly. Check your local health authority website or the CDC and WHO websites for updates and news. °When should I contact a health care provider? °· Contact your health care provider if you have symptoms of an  infection, such as fever or cough, and you: °? Have been near anyone who is known to have coronavirus disease. °? Have come into contact with a person who is suspected to have coronavirus disease. °? Have traveled outside of the country. °When should I get emergency medical care? °· Get help right away by calling your local emergency services (911 in the [place] if you have: °? Trouble breathing. °? Pain or pressure in your chest. °? Confusion. °? Blue-tinged lips and fingernails. °? Difficulty waking from sleep. °? Symptoms that get worse. °Let the emergency medical personnel know if you think you have coronavirus disease. °Summary °· A new respiratory virus is spreading from person to person and causing COVID-19 (coronavirus disease). °· The virus that causes COVID-19 appears to spread easily. It spreads from one person to another through droplets from coughing or sneezing. °· Older adults and those with chronic diseases are at higher risk of disease. If you are at higher risk for complications, take extra precautions. °· There is currently no vaccine to prevent coronavirus disease. There are no medicines, such as antibiotics or antivirals, to treat the virus. °· You can protect yourself and your family by washing your hands often, avoiding touching your face, and covering your coughs and sneezes. °This information is not intended to replace advice given to you by your health care provider. Make sure you discuss any questions you have with your health care provider. °Document Released: 10/17/2018 Document Revised: 10/17/2018 Document Reviewed: 10/17/2018 °Elsevier Patient Education © 2020 Elsevier Inc. ° °

## 2019-07-13 ENCOUNTER — Other Ambulatory Visit: Payer: Self-pay | Admitting: Family Medicine

## 2019-07-26 ENCOUNTER — Ambulatory Visit
Admission: RE | Admit: 2019-07-26 | Discharge: 2019-07-26 | Disposition: A | Discharge status: Home / Self Care | Payer: Medicare PPO | Source: Ambulatory Visit | Attending: Family Medicine | Admitting: Family Medicine

## 2019-07-26 ENCOUNTER — Other Ambulatory Visit: Payer: Self-pay

## 2019-07-26 DIAGNOSIS — Z1231 Encounter for screening mammogram for malignant neoplasm of breast: Secondary | ICD-10-CM | POA: Diagnosis not present

## 2019-08-13 DIAGNOSIS — H1789 Other corneal scars and opacities: Secondary | ICD-10-CM | POA: Diagnosis not present

## 2019-08-13 DIAGNOSIS — H18591 Other hereditary corneal dystrophies, right eye: Secondary | ICD-10-CM | POA: Diagnosis not present

## 2019-08-29 ENCOUNTER — Other Ambulatory Visit: Payer: Self-pay | Admitting: Family Medicine

## 2019-08-30 ENCOUNTER — Encounter: Payer: Self-pay | Admitting: Family Medicine

## 2019-08-31 ENCOUNTER — Encounter: Payer: Self-pay | Admitting: Family Medicine

## 2019-09-06 DIAGNOSIS — Z961 Presence of intraocular lens: Secondary | ICD-10-CM | POA: Diagnosis not present

## 2019-09-21 ENCOUNTER — Other Ambulatory Visit: Payer: Self-pay | Admitting: Family Medicine

## 2019-09-29 ENCOUNTER — Ambulatory Visit: Payer: Medicare PPO | Attending: Internal Medicine

## 2019-09-29 DIAGNOSIS — Z23 Encounter for immunization: Secondary | ICD-10-CM

## 2019-09-29 NOTE — Progress Notes (Signed)
   Covid-19 Vaccination Clinic  Name:  Kieona Bergkamp    MRN: [id number] DOB: 1945/12/16  09/29/2019  Ms. O'Bryant was observed post Covid-19 immunization for 15 minutes without incident. She was provided with Vaccine Information Sheet and instruction to access the V-Safe system.   Ms. Iodice was instructed to call 911 with any severe reactions post vaccine: Marland Kitchen Difficulty breathing  . Swelling of face and throat  . A fast heartbeat  . A bad rash all over body  . Dizziness and weakness   Immunizations Administered    Name Date Dose VIS Date Route   Pfizer COVID-19 Vaccine 09/29/2019  8:26 AM 0.3 mL 06/15/2019 Intramuscular   Manufacturer: [place]   Lot: [id number]   [place]: [id number]

## 2019-10-03 ENCOUNTER — Other Ambulatory Visit: Payer: Self-pay | Admitting: Family Medicine

## 2019-10-06 ENCOUNTER — Other Ambulatory Visit: Payer: Self-pay | Admitting: Family Medicine

## 2019-10-10 ENCOUNTER — Other Ambulatory Visit: Payer: Medicare PPO

## 2019-10-10 ENCOUNTER — Other Ambulatory Visit: Payer: Self-pay

## 2019-10-10 DIAGNOSIS — R7303 Prediabetes: Secondary | ICD-10-CM

## 2019-10-10 DIAGNOSIS — Z0000 Encounter for general adult medical examination without abnormal findings: Secondary | ICD-10-CM | POA: Diagnosis not present

## 2019-10-10 DIAGNOSIS — Z13220 Encounter for screening for lipoid disorders: Secondary | ICD-10-CM | POA: Diagnosis not present

## 2019-10-11 LAB — CBC WITH DIFFERENTIAL/PLATELET
Absolute Monocytes: 523 cells/uL (ref 200–950)
Basophils Absolute: 32 cells/uL (ref 0–200)
Basophils Relative: 0.5 %
Eosinophils Absolute: 113 cells/uL (ref 15–500)
Eosinophils Relative: 1.8 %
HCT: 39.1 % (ref 35.0–45.0)
Hemoglobin: 12.8 g/dL (ref 11.7–15.5)
Lymphs Abs: 2054 cells/uL (ref 850–3900)
MCH: 28.4 pg (ref 27.0–33.0)
MCHC: 32.7 g/dL (ref 32.0–36.0)
MCV: 86.9 fL (ref 80.0–100.0)
MPV: 10.8 fL (ref 7.5–12.5)
Monocytes Relative: 8.3 %
Neutro Abs: 3578 cells/uL (ref 1500–7800)
Neutrophils Relative %: 56.8 %
Platelets: 177 10*3/uL (ref 140–400)
RBC: 4.5 10*6/uL (ref 3.80–5.10)
RDW: 12.5 % (ref 11.0–15.0)
Total Lymphocyte: 32.6 %
WBC: 6.3 10*3/uL (ref 3.8–10.8)

## 2019-10-11 LAB — LIPID PANEL
Cholesterol: 172 mg/dL (ref <200)
HDL: 49 mg/dL — ABNORMAL LOW (ref >=50)
LDL Cholesterol (Calc): 103 mg/dL (calc) — ABNORMAL HIGH
Non-HDL Cholesterol (Calc): 123 mg/dL (calc) (ref <130)
Total CHOL/HDL Ratio: 3.5 (calc) (ref <5.0)
Triglycerides: 108 mg/dL (ref <150)

## 2019-10-11 LAB — COMPREHENSIVE METABOLIC PANEL
AG Ratio: 1.9 (calc) (ref 1.0–2.5)
ALT: 20 U/L (ref 6–29)
AST: 22 U/L (ref 10–35)
Albumin: 4.1 g/dL (ref 3.6–5.1)
Alkaline phosphatase (APISO): 51 U/L (ref 37–153)
BUN: 21 mg/dL (ref 7–25)
CO2: 27 mmol/L (ref 20–32)
Calcium: 9.2 mg/dL (ref 8.6–10.4)
Chloride: 101 mmol/L (ref 98–110)
Creat: 0.86 mg/dL (ref 0.60–0.93)
Globulin: 2.2 g/dL (calc) (ref 1.9–3.7)
Glucose, Bld: 112 mg/dL — ABNORMAL HIGH (ref 65–99)
Potassium: 4.1 mmol/L (ref 3.5–5.3)
Sodium: 138 mmol/L (ref 135–146)
Total Bilirubin: 0.7 mg/dL (ref 0.2–1.2)
Total Protein: 6.3 g/dL (ref 6.1–8.1)

## 2019-10-11 LAB — HEMOGLOBIN A1C
Hgb A1c MFr Bld: 6 % of total Hgb — ABNORMAL HIGH (ref <5.7)
Mean Plasma Glucose: 126 (calc)
eAG (mmol/L): 7 (calc)

## 2019-10-15 ENCOUNTER — Encounter: Payer: Self-pay | Admitting: Family Medicine

## 2019-10-15 ENCOUNTER — Ambulatory Visit (INDEPENDENT_AMBULATORY_CARE_PROVIDER_SITE_OTHER): Payer: Medicare PPO | Admitting: Family Medicine

## 2019-10-15 ENCOUNTER — Telehealth: Payer: Self-pay | Admitting: *Deleted

## 2019-10-15 ENCOUNTER — Other Ambulatory Visit: Payer: Self-pay

## 2019-10-15 VITALS — BP 110/64 | HR 78 | Temp 97.5°F | Resp 16 | Ht 63.0 in | Wt 175.0 lb

## 2019-10-15 DIAGNOSIS — D485 Neoplasm of uncertain behavior of skin: Secondary | ICD-10-CM

## 2019-10-15 DIAGNOSIS — Z0000 Encounter for general adult medical examination without abnormal findings: Secondary | ICD-10-CM

## 2019-10-15 DIAGNOSIS — E7800 Pure hypercholesterolemia, unspecified: Secondary | ICD-10-CM

## 2019-10-15 DIAGNOSIS — Z0001 Encounter for general adult medical examination with abnormal findings: Secondary | ICD-10-CM | POA: Diagnosis not present

## 2019-10-15 DIAGNOSIS — R7303 Prediabetes: Secondary | ICD-10-CM

## 2019-10-15 DIAGNOSIS — I10 Essential (primary) hypertension: Secondary | ICD-10-CM | POA: Diagnosis not present

## 2019-10-15 NOTE — Telephone Encounter (Signed)
-----   Message from Alyson Locket, [place] sent at 10/15/2019  9:54 AM EDT -----  ----- Message ----- From: Susy Frizzle, MD Sent: 10/15/2019   9:48 AM EDT To: Alyson Locket, RMA  Please schedule cologuard

## 2019-10-15 NOTE — Telephone Encounter (Signed)
Received verbal orders for Cologuard.   Order placed via [facility].   Cologuard (Order [id number])

## 2019-10-15 NOTE — Progress Notes (Signed)
Subjective:    Patient ID: Nichole Bryant, female    DOB: [birth year], 74 y.o.   MRN: [id number]  HPI   Patient is a very pleasant 74 year old Caucasian female who presents today for complete physical exam.  She denies any memory loss.  She denies any depression.  She denies any falls although she does report neuropathy in her feet and is occasionally losing her balance.  She is not exercising regularly.  Her last mammogram was performed in January and was normal.  She is over the age of 56 and therefore does not require Pap smear.  She is overdue for a colonoscopy.  She declines a colonoscopy but she would be willing to consent to Cologuard.  Her most recent lab work is listed below. Lab on 10/10/2019  Component Date Value Ref Range Status  . Hgb A1c MFr Bld 10/10/2019 6.0* <5.7 % of total Hgb Final   Comment: For someone without known diabetes, a hemoglobin  A1c value between 5.7% and 6.4% is consistent with prediabetes and should be confirmed with a  follow-up test. . For someone with known diabetes, a value <7% indicates that their diabetes is well controlled. A1c targets should be individualized based on duration of diabetes, age, comorbid conditions, and other considerations. . This assay result is consistent with an increased risk of diabetes. . Currently, no consensus exists regarding use of hemoglobin A1c for diagnosis of diabetes for children. .   . Mean Plasma Glucose 10/10/2019 126  (calc) Final  . eAG (mmol/L) 10/10/2019 7.0  (calc) Final  . Glucose, Bld 10/10/2019 112* 65 - 99 mg/dL Final   Comment: .            Fasting reference interval . For someone without known diabetes, a glucose value between 100 and 125 mg/dL is consistent with prediabetes and should be confirmed with a follow-up test. .   . BUN 10/10/2019 21  7 - 25 mg/dL Final  . Creat 10/10/2019 0.86  0.60 - 0.93 mg/dL Final   Comment: For patients >93 years of age, the reference limit for  Creatinine is approximately 13% higher for people identified as African-American. .   Havery Moros Ratio [id number] NOT APPLICABLE  6 - 22 (calc) Final  . Sodium 10/10/2019 138  135 - 146 mmol/L Final  . Potassium 10/10/2019 4.1  3.5 - 5.3 mmol/L Final  . Chloride 10/10/2019 101  98 - 110 mmol/L Final  . CO2 10/10/2019 27  20 - 32 mmol/L Final  . Calcium 10/10/2019 9.2  8.6 - 10.4 mg/dL Final  . Total Protein 10/10/2019 6.3  6.1 - 8.1 g/dL Final  . Albumin 10/10/2019 4.1  3.6 - 5.1 g/dL Final  . Globulin 10/10/2019 2.2  1.9 - 3.7 g/dL (calc) Final  . AG Ratio 10/10/2019 1.9  1.0 - 2.5 (calc) Final  . Total Bilirubin 10/10/2019 0.7  0.2 - 1.2 mg/dL Final  . Alkaline phosphatase (APISO) 10/10/2019 51  37 - 153 U/L Final  . AST 10/10/2019 22  10 - 35 U/L Final  . ALT 10/10/2019 20  6 - 29 U/L Final  . Cholesterol 10/10/2019 172  <200 mg/dL Final  . HDL 10/10/2019 49* > OR = 50 mg/dL Final  . Triglycerides 10/10/2019 108  <150 mg/dL Final  . LDL Cholesterol (Calc) 10/10/2019 103* mg/dL (calc) Final   Comment: Reference range: <100 . Desirable range <100 mg/dL for primary prevention;   <70 mg/dL for patients with CHD or diabetic patients  with > or = 2 CHD risk factors. Marland Kitchen LDL-C is now calculated using the Martin-Hopkins  calculation, which is a validated novel method providing  better accuracy than the Friedewald equation in the  estimation of LDL-C.  Cresenciano Genre et al. Annamaria Helling. [id number]): 2061-2068  (http://education.[facility].com/faq/FAQ164)   . Total CHOL/HDL Ratio 10/10/2019 3.5  <5.0 (calc) Final  . Non-HDL Cholesterol (Calc) 10/10/2019 123  <130 mg/dL (calc) Final   Comment: For patients with diabetes plus 1 major ASCVD risk  factor, treating to a non-HDL-C goal of <100 mg/dL  (LDL-C of <70 mg/dL) is considered a therapeutic  option.   . WBC 10/10/2019 6.3  3.8 - 10.8 Thousand/uL Final  . RBC 10/10/2019 4.50  3.80 - 5.10 Million/uL Final  . Hemoglobin 10/10/2019  12.8  11.7 - 15.5 g/dL Final  . HCT 10/10/2019 39.1  35.0 - 45.0 % Final  . MCV 10/10/2019 86.9  80.0 - 100.0 fL Final  . MCH 10/10/2019 28.4  27.0 - 33.0 pg Final  . MCHC 10/10/2019 32.7  32.0 - 36.0 g/dL Final  . RDW 10/10/2019 12.5  11.0 - 15.0 % Final  . Platelets 10/10/2019 177  140 - 400 Thousand/uL Final  . MPV 10/10/2019 10.8  7.5 - 12.5 fL Final  . Neutro Abs 10/10/2019 3,578  1,500 - 7,800 cells/uL Final  . Lymphs Abs 10/10/2019 2,054  850 - 3,900 cells/uL Final  . Absolute Monocytes 10/10/2019 523  200 - 950 cells/uL Final  . Eosinophils Absolute 10/10/2019 113  15 - 500 cells/uL Final  . Basophils Absolute 10/10/2019 32  0 - 200 cells/uL Final  . Neutrophils Relative % 10/10/2019 56.8  % Final  . Total Lymphocyte 10/10/2019 32.6  % Final  . Monocytes Relative 10/10/2019 8.3  % Final  . Eosinophils Relative 10/10/2019 1.8  % Final  . Basophils Relative 10/10/2019 0.5  % Final   Labs are excellent except for prediabetes which is currently diet controlled as well as mild elevations in her cholesterol.  Her blood pressure today is excellent at 110/64.  Her immunizations are up-to-date.  Unfortunately she recently had Covid within the last 6 months.  She is completely recovered from this.  She does have a 5 mm hyperkeratotic white scaly papule on the dorsum of her left forearm that appears to be a squamous cell carcinoma.  She is requesting treatment for this today. Immunization History  Administered Date(s) Administered  . Influenza Split 04/25/2013  . Influenza, High Dose Seasonal PF 05/03/2018, 03/31/2019  . Influenza,inj,Quad PF,6+ Mos 04/22/2015, 04/06/2016  . Influenza-Unspecified 05/09/2017, 03/28/2019  . PFIZER SARS-COV-2 Vaccination 09/29/2019  . Pneumococcal Conjugate-13 09/22/2015  . Pneumococcal Polysaccharide-23 07/07/2012  . Td 11/01/2000  . Tdap 07/18/2013  . Zoster 02/11/2014  . Zoster Recombinat (Shingrix) [birth year], 12/30/2017    Past Medical History:    Diagnosis Date  . Diabetes mellitus without complication ([place])   . High cholesterol   . Hypertension    No past surgical history on file. Current Outpatient Medications on File Prior to Visit  Medication Sig Dispense Refill  . atenolol (TENORMIN) 25 MG tablet TAKE 1 TABLET BY MOUTH EVERY DAY 90 tablet 1  . Calcium Carb-Cholecalciferol (CALCIUM 1000 + D PO) Take by mouth.    . hydrochlorothiazide (MICROZIDE) 12.5 MG capsule TAKE 1 CAPSULE BY MOUTH EVERY DAY 90 capsule 1  . lovastatin (MEVACOR) 20 MG tablet TAKE 1 TABLET (20 MG TOTAL) BY MOUTH DAILY. NEEDS OFFICE VISIT AND LABS BEFORE FURTHER REFILLS 30 tablet  0  . meloxicam (MOBIC) 7.5 MG tablet TAKE 1 TABLET BY MOUTH EVERY DAY 90 tablet 3  . Omega-3 Fatty Acids (FISH OIL) 1200 MG CPDR Take by mouth.    . vitamin B-12 (CYANOCOBALAMIN) 1000 MCG tablet Take 1,000 mcg by mouth daily.     No current facility-administered medications on file prior to visit.   Allergies  Allergen Reactions  . Sulfa Antibiotics    Social History   Socioeconomic History  . Marital status: Married    Spouse name: Not on file  . Number of children: Not on file  . Years of education: Not on file  . Highest education level: Not on file  Occupational History  . Not on file  Tobacco Use  . Smoking status: Never Smoker  . Smokeless tobacco: Never Used  Substance and Sexual Activity  . Alcohol use: No  . Drug use: No  . Sexual activity: Not on file  Other Topics Concern  . Not on file  Social History Narrative  . Not on file   Social Determinants of Health   Financial Resource Strain:   . Difficulty of Paying Living Expenses:   Food Insecurity:   . Worried About Charity fundraiser in the Last Year:   . Arboriculturist in the Last Year:   Transportation Needs:   . Film/video editor (Medical):   Marland Kitchen Lack of Transportation (Non-Medical):   Physical Activity:   . Days of Exercise per Week:   . Minutes of Exercise per Session:   Stress:   .  Feeling of Stress :   Social Connections:   . Frequency of Communication with Friends and Family:   . Frequency of Social Gatherings with Friends and Family:   . Attends Religious Services:   . Active Member of Clubs or Organizations:   . Attends Archivist Meetings:   Marland Kitchen Marital Status:   Intimate Partner Violence:   . Fear of Current or Ex-Partner:   . Emotionally Abused:   Marland Kitchen Physically Abused:   . Sexually Abused:    No family history on file.    Review of Systems  All other systems reviewed and are negative.      Objective:   Physical Exam  Constitutional: She is oriented to person, place, and time. She appears well-developed and well-nourished. No distress.  HENT:  Head: Normocephalic and atraumatic.  Right Ear: External ear normal.  Left Ear: External ear normal.  Nose: Nose normal.  Mouth/Throat: Oropharynx is clear and moist. No oropharyngeal exudate.  Eyes: Pupils are equal, round, and reactive to light. Conjunctivae and EOM are normal. Right eye exhibits no discharge. Left eye exhibits no discharge. No scleral icterus.  Neck: No JVD present. No thyromegaly present.  Cardiovascular: Normal rate, regular rhythm, normal heart sounds and intact distal pulses. Exam reveals no gallop and no friction rub.  No murmur heard. Pulmonary/Chest: Effort normal and breath sounds normal. No stridor. No respiratory distress. She has no wheezes. She has no rales. She exhibits no tenderness.  Abdominal: Soft. Bowel sounds are normal. She exhibits no distension and no mass. There is no abdominal tenderness. There is no rebound and no guarding.  Musculoskeletal:        General: Edema present. No tenderness.     Cervical back: Normal range of motion and neck supple. Deformity present. Decreased range of motion.  Lymphadenopathy:    She has no cervical adenopathy.  Neurological: She is alert and oriented to  person, place, and time. She has normal reflexes. No cranial nerve  deficit. She exhibits normal muscle tone. Coordination normal.  Skin: Skin is warm. No rash noted. She is not diaphoretic. No erythema. No pallor.  Psychiatric: She has a normal mood and affect. Her behavior is normal. Judgment and thought content normal.  Vitals reviewed.  Patient has pronounced cervical kyphosis. She also has numerous verrucous veins in both legs as well as a large varicosity just superior to the right medial malleolus      5 mm white scaly papule on the dorsum of the left forearm Assessment & Plan:  Routine general medical examination at a health care facility  Essential hypertension  Pure hypercholesterolemia  Prediabetes   Patient's physical exam today is significant for kyphosis, varicose veins with pitting edema in her legs, and obesity.  Her blood pressure is excellent at 110/64.  Her mammogram is up-to-date.  I treated the suspected squamous cell carcinoma on her left forearm with cryotherapy using liquid nitrogen for a total of 30 seconds.  If lesion persist I would recommend excisional biopsy.  I will schedule the patient for Cologuard.  Pap smear is not required.  Lab work is excellent.  She denies any falls or depression or memory loss.  Regular anticipatory guidance is provided.

## 2019-10-17 ENCOUNTER — Other Ambulatory Visit: Payer: Self-pay | Admitting: Family Medicine

## 2019-10-22 ENCOUNTER — Encounter: Payer: Self-pay | Admitting: Family Medicine

## 2019-10-22 DIAGNOSIS — Z1211 Encounter for screening for malignant neoplasm of colon: Secondary | ICD-10-CM | POA: Diagnosis not present

## 2019-10-22 DIAGNOSIS — Z1212 Encounter for screening for malignant neoplasm of rectum: Secondary | ICD-10-CM | POA: Diagnosis not present

## 2019-10-22 LAB — COLOGUARD

## 2019-10-24 ENCOUNTER — Ambulatory Visit: Payer: Medicare PPO | Attending: Internal Medicine

## 2019-10-24 DIAGNOSIS — Z23 Encounter for immunization: Secondary | ICD-10-CM

## 2019-10-24 NOTE — Progress Notes (Signed)
   Covid-19 Vaccination Clinic  Name:  Amalee Thweatt    MRN: [id number] DOB: March 31, 1946  10/24/2019  Ms. O'Bryant was observed post Covid-19 immunization for 15 minutes without incident. She was provided with Vaccine Information Sheet and instruction to access the V-Safe system.   Ms. Kasler was instructed to call 911 with any severe reactions post vaccine: Marland Kitchen Difficulty breathing  . Swelling of face and throat  . A fast heartbeat  . A bad rash all over body  . Dizziness and weakness   Immunizations Administered    Name Date Dose VIS Date Route   Pfizer COVID-19 Vaccine 10/24/2019  8:29 AM 0.3 mL 08/29/2018 Intramuscular   Manufacturer: [place]   Lot: [id number]   [place]: [id number]

## 2019-10-31 ENCOUNTER — Telehealth: Payer: Self-pay | Admitting: Family Medicine

## 2019-10-31 DIAGNOSIS — R195 Other fecal abnormalities: Secondary | ICD-10-CM

## 2019-10-31 NOTE — Telephone Encounter (Signed)
Pt has positive Cologuard - referral placed for GI

## 2019-11-01 NOTE — Telephone Encounter (Signed)
agree

## 2019-11-07 ENCOUNTER — Telehealth: Payer: Self-pay | Admitting: Internal Medicine

## 2019-11-07 NOTE — Telephone Encounter (Signed)
Yes will accept.

## 2019-11-07 NOTE — Telephone Encounter (Signed)
Dr. Carlean Purl  We received a referral for this pt for a colonoscopy,  she has sent over report. She would like to transfer care to you.Her husband had a colonoscopy with you also. I will send you records, will you accept this patient?

## 2019-11-08 ENCOUNTER — Encounter: Payer: Self-pay | Admitting: Internal Medicine

## 2019-11-10 ENCOUNTER — Other Ambulatory Visit: Payer: Self-pay | Admitting: Family Medicine

## 2019-12-06 ENCOUNTER — Ambulatory Visit (AMBULATORY_SURGERY_CENTER): Payer: Self-pay | Admitting: *Deleted

## 2019-12-06 ENCOUNTER — Other Ambulatory Visit: Payer: Self-pay

## 2019-12-06 VITALS — Ht 63.0 in | Wt 173.0 lb

## 2019-12-06 DIAGNOSIS — Z1211 Encounter for screening for malignant neoplasm of colon: Secondary | ICD-10-CM

## 2019-12-06 NOTE — Progress Notes (Signed)
2nd dose of covid 10-24-19  Pt's previsit is done over the phone and all paperwork (prep instructions, blank consent form to just read over, pre-procedure acknowledgement form and stamped envelope) sent to patient  Pt is aware that care partner will wait in the car during procedure; if they feel like they will be too hot or cold to wait in the car; they may wait in the 4 th floor lobby. Patient is aware to bring only one care partner. We want them to wear a mask (we do not have any that we can provide them), practice social distancing, and we will check their temperatures when they get here.  I did remind the patient that their care partner needs to stay in the parking lot the entire time and have a cell phone available, we will call them when the pt is ready for discharge. Patient will wear mask into building.  No trouble with anesthesia, no trouble moving neck, no fam hx/hx of malignant hyperthermia   Prep instructions sent via MyChart and mailed to pt  No egg or soy allergy  No home oxygen use   No medications for weight loss taken  emmi information given  Pt denies constipation issues

## 2019-12-10 ENCOUNTER — Other Ambulatory Visit: Payer: Self-pay

## 2019-12-10 ENCOUNTER — Ambulatory Visit (AMBULATORY_SURGERY_CENTER): Payer: Medicare PPO | Admitting: Internal Medicine

## 2019-12-10 ENCOUNTER — Encounter: Payer: Self-pay | Admitting: Internal Medicine

## 2019-12-10 VITALS — BP 135/57 | HR 60 | Temp 97.1°F | Resp 15 | Ht 63.0 in | Wt 173.0 lb

## 2019-12-10 DIAGNOSIS — K649 Unspecified hemorrhoids: Secondary | ICD-10-CM

## 2019-12-10 DIAGNOSIS — K5730 Diverticulosis of large intestine without perforation or abscess without bleeding: Secondary | ICD-10-CM | POA: Diagnosis not present

## 2019-12-10 DIAGNOSIS — R195 Other fecal abnormalities: Secondary | ICD-10-CM | POA: Diagnosis not present

## 2019-12-10 MED ORDER — SODIUM CHLORIDE 0.9 % IV SOLN: 500.0000 mL | Freq: Once | INTRAVENOUS | Status: DC

## 2019-12-10 NOTE — Op Note (Signed)
[place] Patient Name: Nichole Bryant Procedure Date: 12/10/2019 10:00 AM MRN: [id number] Endoscopist: Gatha Mayer , MD Age: 74 Referring MD:  Date of Birth: [birth year] Gender: Female Account #: [id number] Procedure:                Colonoscopy Indications:              Positive Cologuard test Medicines:                Propofol per Anesthesia, Monitored Anesthesia Care Procedure:                Pre-Anesthesia Assessment:                           - Prior to the procedure, a History and Physical                            was performed, and patient medications and                            allergies were reviewed. The patient's tolerance of                            previous anesthesia was also reviewed. The risks                            and benefits of the procedure and the sedation                            options and risks were discussed with the patient.                            All questions were answered, and informed consent                            was obtained. Prior Anticoagulants: The patient has                            taken no previous anticoagulant or antiplatelet                            agents. ASA Grade Assessment: II - A patient with                            mild systemic disease. After reviewing the risks                            and benefits, the patient was deemed in                            satisfactory condition to undergo the procedure.                           After obtaining informed consent, the colonoscope  was passed under direct vision. Throughout the                            procedure, the patient's blood pressure, pulse, and                            oxygen saturations were monitored continuously. The                            Colonoscope was introduced through the anus and                            advanced to the the cecum, identified by                            appendiceal orifice  and ileocecal valve. The                            colonoscopy was performed without difficulty. The                            patient tolerated the procedure well. The quality                            of the bowel preparation was excellent. The                            ileocecal valve, appendiceal orifice, and rectum                            were photographed. Scope In: 10:03:46 AM Scope Out: 10:19:18 AM Scope Withdrawal Time: 0 hours 10 minutes 58 seconds  Total Procedure Duration: 0 hours 15 minutes 32 seconds  Findings:                 Multiple diverticula were found in the sigmoid                            colon.                           Internal hemorrhoids were found. The hemorrhoids                            were small.                           The exam was otherwise without abnormality on                            direct and retroflexion views. Complications:            No immediate complications. Estimated Blood Loss:     Estimated blood loss: none. Impression:               - Diverticulosis in the sigmoid colon.                           -  Internal hemorrhoids.                           - The examination was otherwise normal on direct                            and retroflexion views.                           - No specimens collected. Recommendation:           - Patient has a contact number available for                            emergencies. The signs and symptoms of potential                            delayed complications were discussed with the                            patient. Return to normal activities tomorrow.                            Written discharge instructions were provided to the                            patient.                           - Resume previous diet.                           - Continue present medications.                           - No repeat colonoscopy due to age and the absence                            of colonic  polyps. Gatha Mayer, MD 12/10/2019 10:28:00 AM This report has been signed electronically.

## 2019-12-10 NOTE — Progress Notes (Signed)
Pt's states no medical or surgical changes since previsit or office visit. 

## 2019-12-10 NOTE — Patient Instructions (Addendum)
No polyps or cancer seen. Handout given:  Diverticulosis Resume previous diet Continue present medications No repeat colonoscopy due to to age   You have some internal hemorrhoids (as we all do) and diverticulosis.  It is likely that some blood leaked from hemorrhoids to make the Cologuard +.  No further routine colon cancer screening necessary.  I appreciate the opportunity to care for you. Gatha Mayer, MD, FACG YOU HAD AN ENDOSCOPIC PROCEDURE TODAY AT [facility]:   Refer to the procedure report that was given to you for any specific questions about what was found during the examination.  If the procedure report does not answer your questions, please call your gastroenterologist to clarify.  If you requested that your care partner not be given the details of your procedure findings, then the procedure report has been included in a sealed envelope for you to review at your convenience later.  YOU SHOULD EXPECT: Some feelings of bloating in the abdomen. Passage of more gas than usual.  Walking can help get rid of the air that was put into your GI tract during the procedure and reduce the bloating. If you had a lower endoscopy (such as a colonoscopy or flexible sigmoidoscopy) you may notice spotting of blood in your stool or on the toilet paper. If you underwent a bowel prep for your procedure, you may not have a normal bowel movement for a few days.  Please Note:  You might notice some irritation and congestion in your nose or some drainage.  This is from the oxygen used during your procedure.  There is no need for concern and it should clear up in a day or so.  SYMPTOMS TO REPORT IMMEDIATELY:   Following lower endoscopy (colonoscopy or flexible sigmoidoscopy):  Excessive amounts of blood in the stool  Significant tenderness or worsening of abdominal pains  Swelling of the abdomen that is new, acute  Fever of 100F or higher    For urgent or emergent issues, a  gastroenterologist can be reached at any hour by calling [phone]. Do not use MyChart messaging for urgent concerns.    DIET:  We do recommend a small meal at first, but then you may proceed to your regular diet.  Drink plenty of fluids but you should avoid alcoholic beverages for 24 hours.  ACTIVITY:  You should plan to take it easy for the rest of today and you should NOT DRIVE or use heavy machinery until tomorrow (because of the sedation medicines used during the test).    FOLLOW UP: Our staff will call the number listed on your records 48-72 hours following your procedure to check on you and address any questions or concerns that you may have regarding the information given to you following your procedure. If we do not reach you, we will leave a message.  We will attempt to reach you two times.  During this call, we will ask if you have developed any symptoms of COVID 19. If you develop any symptoms (ie: fever, flu-like symptoms, shortness of breath, cough etc.) before then, please call [phone].  If you test positive for Covid 19 in the 2 weeks post procedure, please call and report this information to [place].    If any biopsies were taken you will be contacted by phone or by letter within the next 1-3 weeks.  Please call us at [phone] if you have not heard about the biopsies in 3 weeks.    SIGNATURES/CONFIDENTIALITY: You  and/or your care partner have signed paperwork which will be entered into your electronic medical record.  These signatures attest to the fact that that the information above on your After Visit Summary has been reviewed and is understood.  Full responsibility of the confidentiality of this discharge information lies with you and/or your care-partner.

## 2019-12-10 NOTE — Progress Notes (Signed)
A/ox3, pleased with MAC, report to RN 

## 2019-12-12 ENCOUNTER — Telehealth: Payer: Self-pay

## 2019-12-12 NOTE — Telephone Encounter (Signed)
  Follow up Call-  Call back number 12/10/2019  Post procedure Call Back phone  # [phone]  Permission to leave phone message Yes  Some recent data might be hidden     Patient questions:  Do you have a fever, pain , or abdominal swelling? No. Pain Score  0 *  Have you tolerated food without any problems? Yes.    Have you been able to return to your normal activities? Yes.    Do you have any questions about your discharge instructions: Diet   No. Medications  No. Follow up visit  No.  Do you have questions or concerns about your Care? No.  Actions: * If pain score is 4 or above: 1. No action needed, pain <4.Have you developed a fever since your procedure? no  2.   Have you had an respiratory symptoms (SOB or cough) since your procedure? no  3.   Have you tested positive for COVID 19 since your procedure no  4.   Have you had any family members/close contacts diagnosed with the COVID 19 since your procedure?  no   If yes to any of these questions please route to Joylene John, RN and Erenest Rasher, RN

## 2019-12-17 ENCOUNTER — Encounter: Payer: Medicare PPO | Admitting: Internal Medicine

## 2019-12-25 ENCOUNTER — Other Ambulatory Visit: Payer: Self-pay | Admitting: Family Medicine

## 2020-02-07 ENCOUNTER — Other Ambulatory Visit: Payer: Self-pay | Admitting: Family Medicine

## 2020-03-11 ENCOUNTER — Ambulatory Visit (INDEPENDENT_AMBULATORY_CARE_PROVIDER_SITE_OTHER): Payer: Medicare PPO | Admitting: Family Medicine

## 2020-03-11 ENCOUNTER — Ambulatory Visit: Payer: Medicare PPO | Admitting: Family Medicine

## 2020-03-11 ENCOUNTER — Other Ambulatory Visit: Payer: Self-pay

## 2020-03-11 VITALS — BP 110/60 | HR 84 | Temp 99.1°F | Ht 63.0 in | Wt 174.0 lb

## 2020-03-11 DIAGNOSIS — R0600 Dyspnea, unspecified: Secondary | ICD-10-CM

## 2020-03-11 DIAGNOSIS — R0609 Other forms of dyspnea: Secondary | ICD-10-CM

## 2020-03-11 NOTE — Progress Notes (Signed)
Subjective:    Patient ID: Nichole Bryant, female    DOB: 05-04-1946, 74 y.o.   MRN: [id number]  HPI  At the patient's complete physical exam in January 2020 I recorded this note:  "however over the last few months, the patient has noticed progressive dyspnea on exertion.  She states that recently she had to walk up a slight incline and she had to stop numerous times to catch her breath.  She sometimes gets winded doing minimal activity.  She denies any chest pain.  She denies any angina.  She has chronic leg swelling secondary to varicose veins.  She denies any use an increase in leg swelling or anasarca.  She denies any orthopnea or paroxysmal nocturnal dyspnea.  She denies any cough or hemoptysis."  At that time I obtain an echocardiogram that showed an ejection fraction of 60 to 65%.  There was no significant valvular abnormalities.  The patient did have grade 1 diastolic dysfunction.  I obtained a chest x-ray that showed no acute abnormalities.  The patient has no history of smoking and no occupational exposures for pulmonary fibrosis or emphysema.  Today she presents complaining of worsening dyspnea on exertion.  She states that she has to breathe through her mouth because she feels like she cannot catch her breath.  She does the least little bit of activity such as walking to the pool, she finds her breathing labored and rapid.  This has been going on since December.  She attributed it to lung damage after Covid.  However I believe this is been going on for the last 2 years based on my notes.  She had lab work in April that showed no significant anemia or lab abnormalities.  She denies any palpitations or tachycardia or irregular heartbeats.  She denies any melena or hematochezia.  She denies any cough.  Today on examination her lungs are clear to auscultation bilaterally with no crackles or rails.  She has no audible wheezing.  Pulse oximetry is 96% on room air.  Heart rate is regular but heart  sounds are distant. Past Medical History:  Diagnosis Date  . Arthritis   . Diabetes mellitus without complication (HCC)    no meds  . High cholesterol   . Hypertension   . Varicose veins of left lower extremity    Past Surgical History:  Procedure Laterality Date  . CATARACT EXTRACTION, BILATERAL    . COLONOSCOPY    . TONSILLECTOMY    . TUBAL LIGATION     Current Outpatient Medications on File Prior to Visit  Medication Sig Dispense Refill  . ASPIRIN 81 PO Take by mouth daily.    Marland Kitchen atenolol (TENORMIN) 25 MG tablet TAKE 1 TABLET BY MOUTH EVERY DAY 90 tablet 1  . Calcium Carb-Cholecalciferol (CALCIUM 1000 + D PO) Take by mouth.    . clotrimazole-betamethasone (LOTRISONE) cream APPLY TO AFFECTED AREA TWICE A DAY 30 g 0  . GLUCOSAMINE-CHONDROITIN PO Take by mouth daily.    . hydrochlorothiazide (MICROZIDE) 12.5 MG capsule TAKE 1 CAPSULE BY MOUTH EVERY DAY 90 capsule 1  . lovastatin (MEVACOR) 20 MG tablet TAKE 1 TABLET BY MOUTH EVERYDAY AT BEDTIME 90 tablet 0  . Multiple Vitamin (MULTIVITAMIN ADULT PO) Take by mouth daily.    . Omega-3 Fatty Acids (FISH OIL) 1200 MG CPDR Take by mouth.    . vitamin B-12 (CYANOCOBALAMIN) 1000 MCG tablet Take 5,000 mcg by mouth daily.     . meloxicam (MOBIC) 7.5 MG  tablet TAKE 1 TABLET BY MOUTH EVERY DAY (Patient not taking: Reported on 03/11/2020) 90 tablet 3   No current facility-administered medications on file prior to visit.   Allergies  Allergen Reactions  . Sulfa Antibiotics     Hives, made ears swell   Social History   Socioeconomic History  . Marital status: Married    Spouse name: Not on file  . Number of children: Not on file  . Years of education: Not on file  . Highest education level: Not on file  Occupational History  . Not on file  Tobacco Use  . Smoking status: Never Smoker  . Smokeless tobacco: Never Used  Vaping Use  . Vaping Use: Never used  Substance and Sexual Activity  . Alcohol use: Yes    Comment: wine weekly  .  Drug use: No  . Sexual activity: Not on file  Other Topics Concern  . Not on file  Social History Narrative  . Not on file   Social Determinants of Health   Financial Resource Strain:   . Difficulty of Paying Living Expenses: Not on file  Food Insecurity:   . Worried About Charity fundraiser in the Last Year: Not on file  . Ran Out of Food in the Last Year: Not on file  Transportation Needs:   . Lack of Transportation (Medical): Not on file  . Lack of Transportation (Non-Medical): Not on file  Physical Activity:   . Days of Exercise per Week: Not on file  . Minutes of Exercise per Session: Not on file  Stress:   . Feeling of Stress : Not on file  Social Connections:   . Frequency of Communication with Friends and Family: Not on file  . Frequency of Social Gatherings with Friends and Family: Not on file  . Attends Religious Services: Not on file  . Active Member of Clubs or Organizations: Not on file  . Attends Archivist Meetings: Not on file  . Marital Status: Not on file  Intimate Partner Violence:   . Fear of Current or Ex-Partner: Not on file  . Emotionally Abused: Not on file  . Physically Abused: Not on file  . Sexually Abused: Not on file   Family History  Problem Relation Age of Onset  . Colon cancer Neg Hx   . Esophageal cancer Neg Hx   . Rectal cancer Neg Hx   . Stomach cancer Neg Hx       Review of Systems  All other systems reviewed and are negative.      Objective:   Physical Exam Vitals reviewed.  Constitutional:      General: She is not in acute distress.    Appearance: She is well-developed. She is not diaphoretic.  HENT:     Head: Normocephalic and atraumatic.     Right Ear: External ear normal.     Left Ear: External ear normal.     Nose: Nose normal.     Mouth/Throat:     Pharynx: No oropharyngeal exudate.  Eyes:     General: No scleral icterus.       Right eye: No discharge.        Left eye: No discharge.      Conjunctiva/sclera: Conjunctivae normal.     Pupils: Pupils are equal, round, and reactive to light.  Neck:     Thyroid: No thyromegaly.     Vascular: No JVD.  Cardiovascular:     Rate and Rhythm: Normal  rate and regular rhythm.     Heart sounds: Normal heart sounds. No murmur heard.  No friction rub. No gallop.   Pulmonary:     Effort: Pulmonary effort is normal. No respiratory distress.     Breath sounds: Normal breath sounds. No stridor. No wheezing or rales.  Chest:     Chest wall: No tenderness.  Abdominal:     General: Bowel sounds are normal. There is no distension.     Palpations: Abdomen is soft. There is no mass.     Tenderness: There is no abdominal tenderness. There is no guarding or rebound.  Musculoskeletal:        General: No tenderness.     Cervical back: Normal range of motion and neck supple. Deformity present.  Lymphadenopathy:     Cervical: No cervical adenopathy.  Skin:    General: Skin is warm.     Coloration: Skin is not pale.     Findings: No erythema or rash.  Neurological:     Mental Status: She is alert and oriented to person, place, and time.     Cranial Nerves: No cranial nerve deficit.     Motor: No abnormal muscle tone.     Coordination: Coordination normal.     Deep Tendon Reflexes: Reflexes are normal and symmetric.  Psychiatric:        Behavior: Behavior normal.        Thought Content: Thought content normal.        Judgment: Judgment normal.    Patient has pronounced cervical kyphosis. She also has numerous verrucous veins in both legs as well as a large varicosity just superior to the right medial malleolus       Assessment & Plan:  Dyspnea on exertion - Plan: Ambulatory referral to Cardiology  I suspect deconditioning coupled with decreased lung capacity due to body habitus from cervical kyphosis.  However I have recommended a cardiology consultation for a stress test given her age and other risk factors to rule out ischemia.  She  denies any chest pain however I would believe this would be prudent as part of our work-up.  I would also recommend pulmonary function testing although on examination she has no physical stigmata for emphysema or restrictive lung disease.  There is no clubbing.  She does not have a barrel chest.

## 2020-03-18 ENCOUNTER — Other Ambulatory Visit: Payer: Self-pay | Admitting: Family Medicine

## 2020-03-18 ENCOUNTER — Telehealth: Payer: Self-pay

## 2020-03-18 MED ORDER — BENZONATATE 200 MG PO CAPS: 200.0000 mg | ORAL_CAPSULE | Freq: Two times a day (BID) | ORAL | 0 refills | Status: DC | PRN

## 2020-03-18 NOTE — Telephone Encounter (Signed)
Advised patient of new prescription with verbal understanding.

## 2020-03-18 NOTE — Telephone Encounter (Signed)
Patient called stating she has a cough and chest congestion. Took a home covid test and was negative. Is wanting to know if there is anything you could call in for her symptoms. Has been taking otc meds with no relieve

## 2020-03-18 NOTE — Telephone Encounter (Signed)
I will order tessalon perles.

## 2020-03-24 ENCOUNTER — Encounter: Payer: Self-pay | Admitting: Cardiology

## 2020-03-24 ENCOUNTER — Ambulatory Visit: Payer: Medicare PPO | Admitting: Cardiology

## 2020-03-24 ENCOUNTER — Other Ambulatory Visit: Payer: Self-pay

## 2020-03-24 VITALS — BP 120/84 | HR 61 | Ht 63.0 in | Wt 172.0 lb

## 2020-03-24 DIAGNOSIS — R072 Precordial pain: Secondary | ICD-10-CM

## 2020-03-24 DIAGNOSIS — Z01812 Encounter for preprocedural laboratory examination: Secondary | ICD-10-CM | POA: Diagnosis not present

## 2020-03-24 DIAGNOSIS — E7800 Pure hypercholesterolemia, unspecified: Secondary | ICD-10-CM

## 2020-03-24 DIAGNOSIS — R0600 Dyspnea, unspecified: Secondary | ICD-10-CM | POA: Diagnosis not present

## 2020-03-24 DIAGNOSIS — I10 Essential (primary) hypertension: Secondary | ICD-10-CM

## 2020-03-24 DIAGNOSIS — R0609 Other forms of dyspnea: Secondary | ICD-10-CM

## 2020-03-24 MED ORDER — METOPROLOL TARTRATE 50 MG PO TABS: 50.0000 mg | ORAL_TABLET | Freq: Once | ORAL | 0 refills | Status: DC

## 2020-03-24 NOTE — Patient Instructions (Signed)
Medication Instructions:   Your physician recommends that you continue on your current medications as directed. Please refer to the Current Medication list given to you today.  **SEE Day of CTA procedure institutions for premedication.  *If you need a refill on your cardiac medications before your next appointment, please call your pharmacy*   Lab Work:  Please return for lab work within 2 weeks of your scheduled CTA.  - Please go to the [facility]. You will check in at the front desk to the right as you walk into the atrium. Valet Parking is offered if needed. - No appointment needed. You may go any day between 7 am and 6 pm.    Testing/Procedures:  1)  Your physician has requested that you have an echocardiogram. Echocardiography is a painless test that uses sound waves to create images of your heart. It provides your doctor with information about the size and shape of your heart and how well your heart's chambers and valves are working. This procedure takes approximately one hour. There are no restrictions for this procedure.  2)  Your physician has requested that you have cardiac CT. Cardiac computed tomography (CT) is a painless test that uses an x-ray machine to take clear, detailed pictures of your heart.    Your cardiac CT will be scheduled at:  [facility] [address] [place], [place] [address] [phone]   At [facility], please arrive 15 mins early for check-in and test prep.  Please follow these instructions carefully (unless otherwise directed):   On the Night Before the Test: . Be sure to Drink plenty of water. . Do not consume any caffeinated/decaffeinated beverages or chocolate 12 hours prior to your test. . Do not take any antihistamines 12 hours prior to your test.   On the Day of the Test: . Drink plenty of water. Do not drink any water within one hour of the test. . Do  not eat any food 4 hours prior to the test. . You may take your regular medications prior to the test.  . Take metoprolol (Lopressor) two hours prior to test. . HOLD Hydrochlorothiazide morning of the test. . FEMALES- please wear underwire-free bra if available       After the Test: . Drink plenty of water. . After receiving IV contrast, you may experience a mild flushed feeling. This is normal. . On occasion, you may experience a mild rash up to 24 hours after the test. This is not dangerous. If this occurs, you can take Benadryl 25 mg and increase your fluid intake. . If you experience trouble breathing, this can be serious. If it is severe call 911 IMMEDIATELY. If it is mild, please call our office. . If you take any of these medications: Glipizide/Metformin, Avandament, Glucavance, please do not take 48 hours after completing test unless otherwise instructed.   Once we have confirmed authorization from your insurance company, we will call you to set up a date and time for your test. Based on how quickly your insurance processes prior authorizations requests, please allow up to 4 weeks to be contacted for scheduling your Cardiac CT appointment. Be advised that routine Cardiac CT appointments could be scheduled as many as 8 weeks after your provider has ordered it.  For non-scheduling related questions, please contact the cardiac imaging nurse navigator should you have any questions/concerns: Marchia Bond, Cardiac Imaging Nurse [place], Interim Cardiac Imaging Nurse Navigator [place]  [facility] Direct Office Dial: [phone]   For scheduling needs, including cancellations and rescheduling, please call Vivien Rota at [phone], option 3.     Follow-Up: At [facility], you and your health needs are our priority.  As part of our continuing mission to provide you with exceptional heart care, we have created designated Provider Care Teams.  These Care Teams  include your primary Cardiologist (physician) and Advanced Practice Providers (APPs -  Physician Assistants and Nurse Practitioners) who all work together to provide you with the care you need, when you need it.  We recommend signing up for the patient portal called "MyChart".  Sign up information is provided on this After Visit Summary.  MyChart is used to connect with patients for Virtual Visits (Telemedicine).  Patients are able to view lab/test results, encounter notes, upcoming appointments, etc.  Non-urgent messages can be sent to your provider as well.   To learn more about what you can do with MyChart, go to [email or web address].    Your next appointment:   Follow up after Echo and CTA   The format for your next appointment:   In Person  Provider:   Kate Sable, MD   Other Instructions

## 2020-03-24 NOTE — Progress Notes (Signed)
Cardiology Office Note:    Date:  03/24/2020   ID:  Nichole Bryant, Wyrick 1945-12-13, MRN [id number]  PCP:  Susy Frizzle, MD  [place] Cardiologist:  No primary care provider on file.  [facility] Electrophysiologist:  None   Referring MD: Susy Frizzle, MD   Chief Complaint  Patient presents with  . New Patient (Initial Visit)    Referred by Dr. Dennard Schaumann for DOE and LE edema; Meds verbally reviewed with patient.    History of Present Illness:    Nichole Bryant is a 74 y.o. female with a hx of hypertension, hyperlipidemia who presents due to dyspnea on exertion.  Patient states having worsening dyspnea on exertion over the past several months.  Denies symptoms at rest.  She gets out of breath with ordinary activity such as walking across her home.  She is sometimes noted wheezing when she lays down.  Denies smoking, or any prior history of heart disease.  Has edema due to varicose veins.  Denies palpitations, chest pain, dizziness, presyncope or syncope.  Echocardiogram on 07/2018 showed normal systolic function, EF 60 to 78%, diastolic dysfunction grade 1.  No significant valvular abnormalities.  Past Medical History:  Diagnosis Date  . Arthritis   . Diabetes mellitus without complication (HCC)    no meds  . High cholesterol   . Hypertension   . Varicose veins of left lower extremity     Past Surgical History:  Procedure Laterality Date  . CATARACT EXTRACTION, BILATERAL    . COLONOSCOPY    . TONSILLECTOMY    . TUBAL LIGATION      Current Medications: Current Meds  Medication Sig  . ASPIRIN 81 PO Take 81 mg by mouth daily.   Marland Kitchen atenolol (TENORMIN) 25 MG tablet TAKE 1 TABLET BY MOUTH EVERY DAY  . benzonatate (TESSALON) 200 MG capsule Take 1 capsule (200 mg total) by mouth 2 (two) times daily as needed for cough.  . Calcium Carb-Cholecalciferol (CALCIUM 1000 + D PO) Take by mouth daily.   . clotrimazole-betamethasone (LOTRISONE) cream Apply 1  application topically 2 (two) times daily as needed.  Marland Kitchen GLUCOSAMINE-CHONDROITIN PO Take by mouth daily.  . hydrochlorothiazide (MICROZIDE) 12.5 MG capsule TAKE 1 CAPSULE BY MOUTH EVERY DAY  . lovastatin (MEVACOR) 20 MG tablet TAKE 1 TABLET BY MOUTH EVERYDAY AT BEDTIME  . meloxicam (MOBIC) 7.5 MG tablet TAKE 1 TABLET BY MOUTH EVERY DAY  . Multiple Vitamin (MULTIVITAMIN ADULT PO) Take by mouth daily.  . Omega-3 Fatty Acids (FISH OIL) 1200 MG CPDR Take by mouth daily.   . vitamin B-12 (CYANOCOBALAMIN) 1000 MCG tablet Take 5,000 mcg by mouth daily.      Allergies:   Sulfa antibiotics   Social History   Socioeconomic History  . Marital status: Married    Spouse name: Not on file  . Number of children: Not on file  . Years of education: Not on file  . Highest education level: Not on file  Occupational History  . Not on file  Tobacco Use  . Smoking status: Never Smoker  . Smokeless tobacco: Never Used  Vaping Use  . Vaping Use: Never used  Substance and Sexual Activity  . Alcohol use: Yes    Comment: wine occassionally  . Drug use: No  . Sexual activity: Not on file  Other Topics Concern  . Not on file  Social History Narrative  . Not on file   Social Determinants of Health   Financial  Resource Strain:   . Difficulty of Paying Living Expenses: Not on file  Food Insecurity:   . Worried About Charity fundraiser in the Last Year: Not on file  . Ran Out of Food in the Last Year: Not on file  Transportation Needs:   . Lack of Transportation (Medical): Not on file  . Lack of Transportation (Non-Medical): Not on file  Physical Activity:   . Days of Exercise per Week: Not on file  . Minutes of Exercise per Session: Not on file  Stress:   . Feeling of Stress : Not on file  Social Connections:   . Frequency of Communication with Friends and Family: Not on file  . Frequency of Social Gatherings with Friends and Family: Not on file  . Attends Religious Services: Not on file  .  Active Member of Clubs or Organizations: Not on file  . Attends Archivist Meetings: Not on file  . Marital Status: Not on file     Family History: The patient's family history is negative for Colon cancer, Esophageal cancer, Rectal cancer, and Stomach cancer.  ROS:   Please see the history of present illness.     All other systems reviewed and are negative.  EKGs/Labs/Other Studies Reviewed:    The following studies were reviewed today:   EKG:  EKG is  ordered today.  The ekg ordered today demonstrates sinus rhythm, occasional PVC, first-degree AV block.  Recent Labs: 10/10/2019: ALT 20; BUN 21; Creat 0.86; Hemoglobin 12.8; Platelets 177; Potassium 4.1; Sodium 138  Recent Lipid Panel    Component Value Date/Time   CHOL 172 10/10/2019 0944   TRIG 108 10/10/2019 0944   HDL 49 (L) 10/10/2019 0944   CHOLHDL 3.5 10/10/2019 0944   VLDL 17 02/05/2015 0937   LDLCALC 103 (H) 10/10/2019 0944    Physical Exam:    VS:  BP 120/84 (BP Location: Right Arm, Patient Position: Sitting, Cuff Size: Normal)   Pulse 61   Ht '5\' 3"'  (1.6 m)   Wt 172 lb (78 kg)   SpO2 98%   BMI 30.47 kg/m     Wt Readings from Last 3 Encounters:  03/24/20 172 lb (78 kg)  03/11/20 174 lb (78.9 kg)  12/10/19 173 lb (78.5 kg)     GEN:  Well nourished, well developed in no acute distress HEENT: Normal NECK: No JVD; No carotid bruits LYMPHATICS: No lymphadenopathy CARDIAC: RRR, no murmurs, rubs, gallops RESPIRATORY:  Clear to auscultation without rales, wheezing or rhonchi  ABDOMEN: Soft, non-tender, non-distended MUSCULOSKELETAL: 1+ edema, varicose veins noted SKIN: Warm and dry NEUROLOGIC:  Alert and oriented x 3 PSYCHIATRIC:  Normal affect   ASSESSMENT:    1. Dyspnea on exertion   2. Essential hypertension   3. Pure hypercholesterolemia   4. Precordial pain   5. Pre-procedure lab exam    PLAN:    In order of problems listed above:  1. Patient dyspnea on exertion which is worsening.   Prior echocardiogram roughly 2 years ago showed normal systolic function, EF 60 to 65%, impaired relaxation.  She has risk factors of hypertension, hyperlipidemia.  This could be an anginal equivalent.  Due to ongoing symptoms, will order coronary CTA to evaluate presence of CAD.  Will repeat echocardiogram since it has been almost 2 years since last echo. 2. History of hypertension, BP adequately controlled, continue HCTZ, atenolol. 3. History of hyperlipidemia, continue statin.  Follow-up after echo and coronary CTA.  Total encounter time 60  minutes  Greater than 50% was spent in counseling and coordination of care with the patient   This note was generated in part or whole with voice recognition software. Voice recognition is usually quite accurate but there are transcription errors that can and very often do occur. I apologize for any typographical errors that were not detected and corrected.  Medication Adjustments/Labs and Tests Ordered: Current medicines are reviewed at length with the patient today.  Concerns regarding medicines are outlined above.  Orders Placed This Encounter  Procedures  . CT CORONARY MORPH W/CTA COR W/SCORE W/CA W/CM &/OR WO/CM  . CT CORONARY FRACTIONAL FLOW RESERVE DATA PREP  . CT CORONARY FRACTIONAL FLOW RESERVE FLUID ANALYSIS  . Basic metabolic panel  . EKG 12-Lead  . ECHOCARDIOGRAM COMPLETE   Meds ordered this encounter  Medications  . metoprolol tartrate (LOPRESSOR) 50 MG tablet    Sig: Take 1 tablet (50 mg total) by mouth once for 1 dose. Take 2 hours prior to your CT scan.    Dispense:  1 tablet    Refill:  0    Patient Instructions  Medication Instructions:   Your physician recommends that you continue on your current medications as directed. Please refer to the Current Medication list given to you today.  **SEE Day of CTA procedure institutions for premedication.  *If you need a refill on your cardiac medications before your next  appointment, please call your pharmacy*   Lab Work:  Please return for lab work within 2 weeks of your scheduled CTA.  - Please go to the [facility]. You will check in at the front desk to the right as you walk into the atrium. Valet Parking is offered if needed. - No appointment needed. You may go any day between 7 am and 6 pm.    Testing/Procedures:  1)  Your physician has requested that you have an echocardiogram. Echocardiography is a painless test that uses sound waves to create images of your heart. It provides your doctor with information about the size and shape of your heart and how well your heart's chambers and valves are working. This procedure takes approximately one hour. There are no restrictions for this procedure.  2)  Your physician has requested that you have cardiac CT. Cardiac computed tomography (CT) is a painless test that uses an x-ray machine to take clear, detailed pictures of your heart.    Your cardiac CT will be scheduled at:  [facility] [address] [place],  [address] [phone]   At [facility], please arrive 15 mins early for check-in and test prep.  Please follow these instructions carefully (unless otherwise directed):   On the Night Before the Test: . Be sure to Drink plenty of water. . Do not consume any caffeinated/decaffeinated beverages or chocolate 12 hours prior to your test. . Do not take any antihistamines 12 hours prior to your test.   On the Day of the Test: . Drink plenty of water. Do not drink any water within one hour of the test. . Do not eat any food 4 hours prior to the test. . You may take your regular medications prior to the test.  . Take metoprolol (Lopressor) two hours prior to test. . HOLD Hydrochlorothiazide morning of the test. . FEMALES- please wear underwire-free bra if available       After the Test: . Drink plenty of  water. . After receiving IV contrast, you  may experience a mild flushed feeling. This is normal. . On occasion, you may experience a mild rash up to 24 hours after the test. This is not dangerous. If this occurs, you can take Benadryl 25 mg and increase your fluid intake. . If you experience trouble breathing, this can be serious. If it is severe call 911 IMMEDIATELY. If it is mild, please call our office. . If you take any of these medications: Glipizide/Metformin, Avandament, Glucavance, please do not take 48 hours after completing test unless otherwise instructed.   Once we have confirmed authorization from your insurance company, we will call you to set up a date and time for your test. Based on how quickly your insurance processes prior authorizations requests, please allow up to 4 weeks to be contacted for scheduling your Cardiac CT appointment. Be advised that routine Cardiac CT appointments could be scheduled as many as 8 weeks after your provider has ordered it.  For non-scheduling related questions, please contact the cardiac imaging nurse navigator should you have any questions/concerns: Marchia Bond, Cardiac Imaging Nurse Navigator Burley Saver, Interim Cardiac Imaging Nurse [place] [facility] Services Direct Office Dial: [phone]   For scheduling needs, including cancellations and rescheduling, please call Vivien Rota at [phone], option [phone].     Follow-Up: At [facility], you and your health needs are our priority.  As part of our continuing mission to provide you with exceptional heart care, we have created designated Provider Care Teams.  These Care Teams include your primary Cardiologist (physician) and Advanced Practice Providers (APPs -  Physician Assistants and Nurse Practitioners) who all work together to provide you with the care you need, when you need it.  We recommend signing up for the patient portal called "MyChart".  Sign up information is  provided on this After Visit Summary.  MyChart is used to connect with patients for Virtual Visits (Telemedicine).  Patients are able to view lab/test results, encounter notes, upcoming appointments, etc.  Non-urgent messages can be sent to your provider as well.   To learn more about what you can do with MyChart, go to [email or web address].    Your next appointment:   Follow up after Echo and CTA   The format for your next appointment:   In Person  Provider:   Kate Sable, MD   Other Instructions      Signed, Kate Sable, MD  03/24/2020 1:09 PM    [place]

## 2020-03-26 ENCOUNTER — Ambulatory Visit (INDEPENDENT_AMBULATORY_CARE_PROVIDER_SITE_OTHER): Payer: Medicare PPO

## 2020-03-26 ENCOUNTER — Other Ambulatory Visit: Payer: Self-pay

## 2020-03-26 DIAGNOSIS — R0609 Other forms of dyspnea: Secondary | ICD-10-CM

## 2020-03-26 DIAGNOSIS — R0600 Dyspnea, unspecified: Secondary | ICD-10-CM | POA: Diagnosis not present

## 2020-03-26 LAB — ECHOCARDIOGRAM COMPLETE
AR max vel: 2.22 cm2
AV Area VTI: 2.16 cm2
AV Area mean vel: 1.85 cm2
AV Mean grad: 4 mmHg
AV Peak grad: 6.7 mmHg
Ao pk vel: 1.29 m/s
S' Lateral: 3.7 cm

## 2020-03-26 MED ORDER — PERFLUTREN LIPID MICROSPHERE
1.0000 mL | INTRAVENOUS | Status: AC | PRN
  Administered 2020-03-26: 2 mL via INTRAVENOUS

## 2020-03-31 ENCOUNTER — Telehealth: Payer: Self-pay

## 2020-03-31 NOTE — Telephone Encounter (Signed)
Called and left VM per DPR on file with the following result note:  Normal systolic function, mildly enlarged RV. Overall okay echocardiogram.

## 2020-04-01 ENCOUNTER — Telehealth (HOSPITAL_COMMUNITY): Payer: Self-pay | Admitting: *Deleted

## 2020-04-01 NOTE — Telephone Encounter (Signed)
Attempted to call patient regarding upcoming cardiac CT appointment. Left message on voicemail with name and callback number  Oaklyn Jakubek Tai RN Navigator Cardiac Imaging [facility] [phone] Office [phone] Cell  

## 2020-04-02 ENCOUNTER — Telehealth (HOSPITAL_COMMUNITY): Payer: Self-pay | Admitting: Emergency Medicine

## 2020-04-02 ENCOUNTER — Other Ambulatory Visit
Admission: RE | Admit: 2020-04-02 | Discharge: 2020-04-02 | Disposition: A | Discharge status: Home / Self Care | Payer: Medicare PPO | Attending: Cardiology | Admitting: Cardiology

## 2020-04-02 DIAGNOSIS — R0600 Dyspnea, unspecified: Secondary | ICD-10-CM | POA: Diagnosis not present

## 2020-04-02 DIAGNOSIS — I10 Essential (primary) hypertension: Secondary | ICD-10-CM | POA: Insufficient documentation

## 2020-04-02 DIAGNOSIS — R0609 Other forms of dyspnea: Secondary | ICD-10-CM

## 2020-04-02 LAB — BASIC METABOLIC PANEL
Anion gap: 9 (ref 5–15)
BUN: 19 mg/dL (ref 8–23)
CO2: 27 mmol/L (ref 22–32)
Calcium: 8.9 mg/dL (ref 8.9–10.3)
Chloride: 103 mmol/L (ref 98–111)
Creatinine, Ser: 0.81 mg/dL (ref 0.44–1.00)
GFR calc Af Amer: >60 mL/min (ref >60)
GFR calc non Af Amer: >60 mL/min (ref >60)
Glucose, Bld: 128 mg/dL — ABNORMAL HIGH (ref 70–99)
Potassium: 4.1 mmol/L (ref 3.5–5.1)
Sodium: 139 mmol/L (ref 135–145)

## 2020-04-02 NOTE — Telephone Encounter (Signed)
Reaching out to patient to offer assistance regarding upcoming cardiac imaging study; pt verbalizes understanding of appt date/time, parking situation and where to check in, pre-test NPO status and medications ordered, and verified current allergies; name and call back number provided for further questions should they arise Marchia Bond RN Navigator Cardiac Imaging [place] [facility] [phone] office [phone] cell   Pt informed that she does not need to fast prior to lab draw

## 2020-04-03 ENCOUNTER — Other Ambulatory Visit: Payer: Self-pay | Admitting: Family Medicine

## 2020-04-03 ENCOUNTER — Other Ambulatory Visit: Payer: Self-pay

## 2020-04-03 ENCOUNTER — Other Ambulatory Visit: Payer: Self-pay | Admitting: Cardiology

## 2020-04-03 ENCOUNTER — Ambulatory Visit
Admission: RE | Admit: 2020-04-03 | Discharge: 2020-04-03 | Disposition: A | Discharge status: Home / Self Care | Payer: Medicare PPO | Source: Ambulatory Visit | Attending: Cardiology | Admitting: Cardiology

## 2020-04-03 DIAGNOSIS — R072 Precordial pain: Secondary | ICD-10-CM | POA: Diagnosis not present

## 2020-04-03 MED ORDER — NITROGLYCERIN 0.4 MG SL SUBL
0.4000 mg | SUBLINGUAL_TABLET | Freq: Once | SUBLINGUAL | Status: AC
  Administered 2020-04-03: 0.4 mg via SUBLINGUAL

## 2020-04-03 MED ORDER — DILTIAZEM HCL 25 MG/5ML IV SOLN
10.0000 mg | Freq: Once | INTRAVENOUS | Status: AC
  Administered 2020-04-03: 10 mg via INTRAVENOUS

## 2020-04-03 MED ORDER — IOHEXOL 350 MG/ML SOLN
85.0000 mL | Freq: Once | INTRAVENOUS | Status: AC | PRN
  Administered 2020-04-03: 85 mL via INTRAVENOUS

## 2020-04-03 MED ORDER — NITROGLYCERIN 0.4 MG SL SUBL
0.8000 mg | SUBLINGUAL_TABLET | Freq: Once | SUBLINGUAL | Status: AC
  Administered 2020-04-03: 0.8 mg via SUBLINGUAL

## 2020-04-03 NOTE — Progress Notes (Signed)
Patent unable to have CT due to heart rate. Dr Garen Lah ordered patient to be rescheduled next week and order Metoprolol 100 mg 2 hours prior to arrival and hold Hydrochlorothiazide next Thursday. Patient took Hydrochlorothiazide and Metoprolol 50 mg prior to arrival. Patient verbalized understanding.

## 2020-04-04 ENCOUNTER — Other Ambulatory Visit: Payer: Self-pay | Admitting: Cardiology

## 2020-04-04 DIAGNOSIS — R072 Precordial pain: Secondary | ICD-10-CM

## 2020-04-09 ENCOUNTER — Telehealth (HOSPITAL_COMMUNITY): Payer: Self-pay | Admitting: Emergency Medicine

## 2020-04-09 ENCOUNTER — Other Ambulatory Visit (HOSPITAL_COMMUNITY): Payer: Self-pay | Admitting: Emergency Medicine

## 2020-04-09 DIAGNOSIS — I10 Essential (primary) hypertension: Secondary | ICD-10-CM

## 2020-04-09 MED ORDER — METOPROLOL TARTRATE 100 MG PO TABS: 100.0000 mg | ORAL_TABLET | Freq: Once | ORAL | 0 refills | Status: DC

## 2020-04-09 NOTE — Progress Notes (Signed)
One time dose 100mg  metoprolol tartrate for CCTA HR control sent to her pharm on file.

## 2020-04-09 NOTE — Telephone Encounter (Signed)
Reaching out to patient to offer assistance regarding upcoming cardiac imaging study; pt verbalizes understanding of appt date/time, parking situation and where to check in, pre-test NPO status and medications ordered, and verified current allergies; name and call back number provided for further questions should they arise Marchia Bond RN Navigator Cardiac Imaging [facility] [phone] office [phone] cell   Pt informed that I submitted a rx for 100mg  metoprolol tartrate for CCTA appt tomorrow for HR control . Pt verbalized understanding to pick it up and take 2 hr prior to scan.

## 2020-04-10 ENCOUNTER — Other Ambulatory Visit (HOSPITAL_COMMUNITY): Payer: Self-pay | Admitting: Emergency Medicine

## 2020-04-10 ENCOUNTER — Telehealth (HOSPITAL_COMMUNITY): Payer: Self-pay | Admitting: Pharmacy Technician

## 2020-04-10 ENCOUNTER — Other Ambulatory Visit: Payer: Self-pay

## 2020-04-10 ENCOUNTER — Ambulatory Visit
Admission: RE | Admit: 2020-04-10 | Discharge: 2020-04-10 | Disposition: A | Discharge status: Home / Self Care | Payer: Medicare PPO | Source: Ambulatory Visit | Attending: Cardiology | Admitting: Cardiology

## 2020-04-10 DIAGNOSIS — R072 Precordial pain: Secondary | ICD-10-CM | POA: Insufficient documentation

## 2020-04-10 MED ORDER — METOPROLOL TARTRATE 5 MG/5ML IV SOLN: 10.0000 mg | Freq: Once | INTRAVENOUS | Status: DC

## 2020-04-10 MED ORDER — IVABRADINE HCL 5 MG PO TABS: 10.0000 mg | ORAL_TABLET | Freq: Once | ORAL | 0 refills | Status: AC

## 2020-04-10 MED ORDER — NITROGLYCERIN 0.4 MG SL SUBL
0.8000 mg | SUBLINGUAL_TABLET | Freq: Once | SUBLINGUAL | Status: AC
  Administered 2020-04-10: 0.8 mg via SUBLINGUAL

## 2020-04-10 MED ORDER — METOPROLOL TARTRATE 5 MG/5ML IV SOLN
10.0000 mg | Freq: Once | INTRAVENOUS | Status: AC
  Administered 2020-04-10: 10 mg via INTRAVENOUS

## 2020-04-10 MED ORDER — METOPROLOL TARTRATE 100 MG PO TABS: 100.0000 mg | ORAL_TABLET | Freq: Once | ORAL | 0 refills | Status: DC

## 2020-04-10 MED ORDER — IOHEXOL 350 MG/ML SOLN
75.0000 mL | Freq: Once | INTRAVENOUS | Status: AC | PRN
  Administered 2020-04-10: 75 mL via INTRAVENOUS

## 2020-04-10 NOTE — Telephone Encounter (Signed)
error 

## 2020-04-10 NOTE — Progress Notes (Signed)
Pt unable to be scanned at [facility] d/t inability to control HR. Pt to be scheduled at [facility]. Verbal order for 100mg  metoprolol tartrate + 10mg  Ivabradine 2 hr prior to scan sent into her pharmacy.  Marchia Bond RN Navigator Cardiac Imaging [facility] [phone] Office  [phone] Cell

## 2020-04-10 NOTE — Progress Notes (Signed)
Patient took Metoprolol 100 mg prior to arrival. Patient heart rate to high to have cardiac CT completed. Dr Garen Lah ordered patient to be rescheduled at [facility] and to have Metoprolol 100 mg and Ivabradine 10 mg two hours prior to cardiac CT. Nurse navigator notified and patient verbalized understanding.

## 2020-04-11 ENCOUNTER — Other Ambulatory Visit: Payer: Self-pay | Admitting: *Deleted

## 2020-04-11 MED ORDER — METOPROLOL TARTRATE 50 MG PO TABS: 50.0000 mg | ORAL_TABLET | Freq: Once | ORAL | 0 refills | Status: DC

## 2020-04-11 NOTE — Telephone Encounter (Signed)
Please advise if ok to refill Metoprolol Tartrate 50 mg tablet.  Request 1 tablet.  Last filled 03/24/2020  Please advise.

## 2020-04-16 ENCOUNTER — Telehealth (HOSPITAL_COMMUNITY): Payer: Self-pay | Admitting: Emergency Medicine

## 2020-04-16 NOTE — Telephone Encounter (Signed)
Called patient back and left detailed VM informing her that [facility] has Ivabradine samples available for her to pick up at the front desk today.   Gave callback number for further questions. Marchia Bond RN Navigator Cardiac Imaging [facility] Vascular Services [phone] Office  [phone] Cell

## 2020-04-16 NOTE — Telephone Encounter (Signed)
Medication samples have been left at the front desk for the patient to pick up.  Drug name: Corlanor     Strength: 5 mg      Qty: 1 bottle    Dosing instructions: Take 10 mg (2 tablets) 2 hours prior to Cardiac CT

## 2020-04-16 NOTE — Telephone Encounter (Signed)
Phone call from patient stating she was informed from her pharmacy that the rx for ivabradine is not on the formulary per her insurance.   Will attempt to get the patient a sample from one of the heartcare offices.   I confirmed with the patient that she is to take both 100mg  metoprolol AND 10mg  ivabradine 2 hr prior to next CCTA appt on 10/20. Pt verbalized understanding  Marchia Bond RN Navigator Cardiac Imaging [facility] Heart and Vascular Services [phone] Office  [phone] Cell

## 2020-04-21 ENCOUNTER — Telehealth (HOSPITAL_COMMUNITY): Payer: Self-pay | Admitting: Emergency Medicine

## 2020-04-21 NOTE — Telephone Encounter (Signed)
Pt calling to clarify medications to take prior to CCTA .  Pt instructed to take 100mg  metoprolol tartrate AND 10mg  ivabradine 2 hr prior to scan..  Pt appreciated the call back.  Marchia Bond RN Navigator Cardiac Imaging [facility] Heart and Vascular Services [phone] Office  [phone] Cell

## 2020-04-23 ENCOUNTER — Other Ambulatory Visit: Payer: Self-pay

## 2020-04-23 ENCOUNTER — Encounter (HOSPITAL_COMMUNITY): Payer: Self-pay

## 2020-04-23 ENCOUNTER — Ambulatory Visit (HOSPITAL_COMMUNITY)
Admission: RE | Admit: 2020-04-23 | Discharge: 2020-04-23 | Disposition: A | Discharge status: Home / Self Care | Payer: Medicare PPO | Source: Ambulatory Visit | Attending: Cardiology | Admitting: Cardiology

## 2020-04-23 DIAGNOSIS — R072 Precordial pain: Secondary | ICD-10-CM | POA: Insufficient documentation

## 2020-04-23 MED ORDER — NITROGLYCERIN 0.4 MG SL SUBL: 0.8000 mg | SUBLINGUAL_TABLET | Freq: Once | SUBLINGUAL | Status: DC

## 2020-04-23 MED ORDER — NITROGLYCERIN 0.4 MG SL SUBL
SUBLINGUAL_TABLET | SUBLINGUAL | Status: AC
  Filled 2020-04-23: qty 2

## 2020-04-23 MED ORDER — IOHEXOL 350 MG/ML SOLN
80.0000 mL | Freq: Once | INTRAVENOUS | Status: AC | PRN
  Administered 2020-04-23: 80 mL via INTRAVENOUS

## 2020-04-24 ENCOUNTER — Ambulatory Visit (INDEPENDENT_AMBULATORY_CARE_PROVIDER_SITE_OTHER): Payer: Medicare PPO

## 2020-04-24 DIAGNOSIS — Z23 Encounter for immunization: Secondary | ICD-10-CM | POA: Diagnosis not present

## 2020-04-29 ENCOUNTER — Telehealth: Payer: Self-pay | Admitting: *Deleted

## 2020-04-29 NOTE — Telephone Encounter (Signed)
The patient has been notified of the result and verbalized understanding.  All questions (if any) were answered. Nichole Leech, RN 04/29/2020 2:19 PM   Patient requested to have follow up visit on 05/08/20 be a telephone visit.    Patient Consent for Virtual Visit         Nichole Bryant has provided verbal consent on 04/29/2020 for a virtual visit (video or telephone).   CONSENT FOR VIRTUAL VISIT FOR:  Nichole Bryant  By participating in this virtual visit I agree to the following:  I hereby voluntarily request, consent and authorize [employer] and its employed or contracted physicians, physician assistants, nurse practitioners or other licensed health care professionals (the Practitioner), to provide me with telemedicine health care services (the "Services") as deemed necessary by the treating Practitioner. I acknowledge and consent to receive the Services by the Practitioner via telemedicine. I understand that the telemedicine visit will involve communicating with the Practitioner through live audiovisual communication technology and the disclosure of certain medical information by electronic transmission. I acknowledge that I have been given the opportunity to request an in-person assessment or other available alternative prior to the telemedicine visit and am voluntarily participating in the telemedicine visit.  I understand that I have the right to withhold or withdraw my consent to the use of telemedicine in the course of my care at any time, without affecting my right to future care or treatment, and that the Practitioner or I may terminate the telemedicine visit at any time. I understand that I have the right to inspect all information obtained and/or recorded in the course of the telemedicine visit and may receive copies of available information for a reasonable fee.  I understand that some of the potential risks of receiving the Services via telemedicine  include:  Marland Kitchen Delay or interruption in medical evaluation due to technological equipment failure or disruption; . Information transmitted may not be sufficient (e.g. poor resolution of images) to allow for appropriate medical decision making by the Practitioner; and/or  . In rare instances, security protocols could fail, causing a breach of personal health information.  Furthermore, I acknowledge that it is my responsibility to provide information about my medical history, conditions and care that is complete and accurate to the best of my ability. I acknowledge that Practitioner's advice, recommendations, and/or decision may be based on factors not within their control, such as incomplete or inaccurate data provided by me or distortions of diagnostic images or specimens that may result from electronic transmissions. I understand that the practice of medicine is not an exact science and that Practitioner makes no warranties or guarantees regarding treatment outcomes. I acknowledge that a copy of this consent can be made available to me via my patient portal ([place]), or I can request a printed copy by calling the office of [place].    I understand that my insurance will be billed for this visit.   I have read or had this consent read to me. . I understand the contents of this consent, which adequately explains the benefits and risks of the Services being provided via telemedicine.  . I have been provided ample opportunity to ask questions regarding this consent and the Services and have had my questions answered to my satisfaction. . I give my informed consent for the services to be provided through the use of telemedicine in my medical care

## 2020-04-29 NOTE — Telephone Encounter (Signed)
Patient is returning your call.  

## 2020-04-29 NOTE — Telephone Encounter (Signed)
-----   Message from Kate Sable, MD sent at 04/23/2020  7:12 PM EDT ----- Coronary CT with no evidence for obstructive coronary artery disease.  Very minimal calcifications noted in the LAD causing minimal nonobstructive disease.

## 2020-04-29 NOTE — Telephone Encounter (Signed)
Left voicemail message to call back for her results.  

## 2020-05-05 ENCOUNTER — Other Ambulatory Visit: Payer: Self-pay | Admitting: Family Medicine

## 2020-05-08 ENCOUNTER — Telehealth (INDEPENDENT_AMBULATORY_CARE_PROVIDER_SITE_OTHER): Payer: Medicare PPO | Admitting: Cardiology

## 2020-05-08 ENCOUNTER — Other Ambulatory Visit: Payer: Self-pay

## 2020-05-08 ENCOUNTER — Encounter: Payer: Self-pay | Admitting: Cardiology

## 2020-05-08 VITALS — BP 131/65 | HR 85 | Ht 63.0 in | Wt 172.0 lb

## 2020-05-08 DIAGNOSIS — E7800 Pure hypercholesterolemia, unspecified: Secondary | ICD-10-CM | POA: Diagnosis not present

## 2020-05-08 DIAGNOSIS — I10 Essential (primary) hypertension: Secondary | ICD-10-CM | POA: Diagnosis not present

## 2020-05-08 DIAGNOSIS — R0600 Dyspnea, unspecified: Secondary | ICD-10-CM

## 2020-05-08 DIAGNOSIS — R0609 Other forms of dyspnea: Secondary | ICD-10-CM

## 2020-05-08 NOTE — Progress Notes (Signed)
Virtual Visit via Telephone Note   This visit type was conducted due to national recommendations for restrictions regarding the COVID-19 Pandemic (e.g. social distancing) in an effort to limit this patient's exposure and mitigate transmission in our community.  Due to her co-morbid illnesses, this patient is at least at moderate risk for complications without adequate follow up.  This format is felt to be most appropriate for this patient at this time.  The patient did not have access to video technology/had technical difficulties with video requiring transitioning to audio format only (telephone).  All issues noted in this document were discussed and addressed.  No physical exam could be performed with this format.  Please refer to the patient's chart for her  consent to telehealth for [facility].   Date:  05/08/2020   ID:  Nichole Bryant, DOB 09/02/1945, MRN [id number]  Patient Location: Home Provider Location: Office/Clinic  PCP:  Susy Frizzle, MD  Cardiologist:  Kate Sable, MD  Electrophysiologist:  None   Evaluation Performed:  Follow-Up Visit  Chief Complaint: Follow-up after cardiac testing  History of Present Illness:    Nichole Bryant is a 74 y.o. female with history of hypertension, hyperlipidemia presenting for follow-up.  She was previously seen due to worsening dyspnea on exertion.  Due to risk factors, symptoms suggested an anginal equivalent.  Echocardiogram and coronary CTA was ordered to evaluate cardiac function.  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  Denies chest pain.  She is trying to push herself to do more as she believes her symptoms are secondary to being out of shape.  She has no other concerns at this time.   The patient does not have symptoms concerning for COVID-19 infection (fever, chills, cough, or new shortness of breath).    Past Medical History:  Diagnosis Date  . Arthritis   . Diabetes mellitus  without complication (HCC)    no meds  . High cholesterol   . Hypertension   . Varicose veins of left lower extremity    Past Surgical History:  Procedure Laterality Date  . CATARACT EXTRACTION, BILATERAL    . COLONOSCOPY    . TONSILLECTOMY    . TUBAL LIGATION       Current Meds  Medication Sig  . ASPIRIN 81 PO Take 81 mg by mouth daily.   Marland Kitchen atenolol (TENORMIN) 25 MG tablet TAKE 1 TABLET BY MOUTH EVERY DAY  . Calcium Carb-Cholecalciferol (CALCIUM 1000 + D PO) Take by mouth daily.   . clotrimazole-betamethasone (LOTRISONE) cream APPLY TO AFFECTED AREA TWICE A DAY  . clotrimazole-betamethasone (LOTRISONE) cream Apply 1 application topically 2 (two) times daily as needed.  Marland Kitchen GLUCOSAMINE-CHONDROITIN PO Take by mouth daily.  . hydrochlorothiazide (MICROZIDE) 12.5 MG capsule TAKE 1 CAPSULE BY MOUTH EVERY DAY  . lovastatin (MEVACOR) 20 MG tablet TAKE 1 TABLET BY MOUTH EVERYDAY AT BEDTIME  . meloxicam (MOBIC) 7.5 MG tablet TAKE 1 TABLET BY MOUTH EVERY DAY  . Multiple Vitamin (MULTIVITAMIN ADULT PO) Take by mouth daily.  . Omega-3 Fatty Acids (FISH OIL) 1200 MG CPDR Take by mouth daily.   . vitamin B-12 (CYANOCOBALAMIN) 1000 MCG tablet Take 5,000 mcg by mouth daily.      Allergies:   Sulfa antibiotics   Social History   Tobacco Use  . Smoking status: Never Smoker  . Smokeless tobacco: Never Used  Vaping Use  . Vaping Use: Never used  Substance Use Topics  . Alcohol use: Yes  Comment: wine occassionally  . Drug use: No     Family Hx: The patient's family history is negative for Colon cancer, Esophageal cancer, Rectal cancer, and Stomach cancer.  ROS:   Please see the history of present illness.     All other systems reviewed and are negative.   Prior CV studies:   The following studies were reviewed today:    Labs/Other Tests and Data Reviewed:    EKG:  No ECG reviewed.  Recent Labs: 10/10/2019: ALT 20; Hemoglobin 12.8; Platelets 177 04/02/2020: BUN 19;  Creatinine, Ser 0.81; Potassium 4.1; Sodium 139   Recent Lipid Panel Lab Results  Component Value Date/Time   CHOL 172 10/10/2019 09:44 AM   TRIG 108 10/10/2019 09:44 AM   HDL 49 (L) 10/10/2019 09:44 AM   CHOLHDL 3.5 10/10/2019 09:44 AM   LDLCALC 103 (H) 10/10/2019 09:44 AM    Wt Readings from Last 3 Encounters:  05/08/20 172 lb (78 kg)  03/24/20 172 lb (78 kg)  03/11/20 174 lb (78.9 kg)     Objective:    Vital Signs:  BP 131/65   Pulse 85   Ht 5\' 3"  (1.6 m)   Wt 172 lb (78 kg)   BMI 30.47 kg/m    VITAL SIGNS:  reviewed  ASSESSMENT & PLAN:    1. History of dyspnea on exertion.  Echo 03/2020 showed normal systolic function, EF 55 to 60%.  Diastolic function could not be evaluated.  No significant structural findings to suggest etiology of dyspnea.  Coronary CT showed a very low calcium score 6.78.  No evidence of obstructive CAD.  Minimal calcified plaque in the mid LAD noted.  Patient made aware of results and reassured.  Symptoms likely to deconditioning.  Graduated exercises as tolerated advised. 2. History of hypertension, continue HCTZ atenolol. 3. History of hyperlipidemia, continue lovastatin.  Follow-up in 1 year.  COVID-19 Education: The signs and symptoms of COVID-19 were discussed with the patient and how to seek care for testing (follow up with PCP or arrange E-visit).  The importance of social distancing was discussed today.  Time:   Today, I have spent 35 minutes with the patient with telehealth technology discussing the above problems.     Medication Adjustments/Labs and Tests Ordered: Current medicines are reviewed at length with the patient today.  Concerns regarding medicines are outlined above.   Tests Ordered: No orders of the defined types were placed in this encounter.   Medication Changes: No orders of the defined types were placed in this encounter.   Follow Up:  In Person in 1 year(s)  Signed, Kate Sable, MD  05/08/2020 8:23 AM     [place]

## 2020-05-08 NOTE — Patient Instructions (Signed)

## 2020-05-14 ENCOUNTER — Other Ambulatory Visit: Payer: Self-pay

## 2020-05-14 MED ORDER — ATENOLOL 25 MG PO TABS
25.0000 mg | ORAL_TABLET | Freq: Every day | ORAL | 1 refills | Status: DC
Start: 2020-05-14 — End: 2020-11-11

## 2020-07-03 ENCOUNTER — Other Ambulatory Visit: Payer: Self-pay | Admitting: Family Medicine

## 2020-07-30 ENCOUNTER — Other Ambulatory Visit: Payer: Self-pay | Admitting: Family Medicine

## 2020-09-08 DIAGNOSIS — H5202 Hypermetropia, left eye: Secondary | ICD-10-CM | POA: Diagnosis not present

## 2020-09-08 DIAGNOSIS — H04123 Dry eye syndrome of bilateral lacrimal glands: Secondary | ICD-10-CM | POA: Diagnosis not present

## 2020-09-08 DIAGNOSIS — H18592 Other hereditary corneal dystrophies, left eye: Secondary | ICD-10-CM | POA: Diagnosis not present

## 2020-09-08 DIAGNOSIS — Z961 Presence of intraocular lens: Secondary | ICD-10-CM | POA: Diagnosis not present

## 2020-10-23 ENCOUNTER — Other Ambulatory Visit: Payer: Self-pay | Admitting: Family Medicine

## 2020-10-27 ENCOUNTER — Other Ambulatory Visit: Payer: Self-pay

## 2020-10-27 ENCOUNTER — Ambulatory Visit (INDEPENDENT_AMBULATORY_CARE_PROVIDER_SITE_OTHER): Payer: Medicare PPO

## 2020-10-27 DIAGNOSIS — Z0000 Encounter for general adult medical examination without abnormal findings: Secondary | ICD-10-CM | POA: Diagnosis not present

## 2020-10-27 NOTE — Progress Notes (Signed)
Subjective:   Nichole Bryant is a 75 y.o. female who presents for Medicare Annual (Subsequent) preventive examination.  I connected with Nichole Bryant  today by telephone and verified that I am speaking with the correct person using two identifiers. Location patient: home Location provider: work Persons participating in the virtual visit: patient, provider.   I discussed the limitations, risks, security and privacy concerns of performing an evaluation and management service by telephone and the availability of in person appointments. I also discussed with the patient that there may be a patient responsible charge related to this service. The patient expressed understanding and verbally consented to this telephonic visit.    Interactive audio and video telecommunications were attempted between this provider and patient, however failed, due to patient having technical difficulties OR patient did not have access to video capability.  We continued and completed visit with audio only.      Review of Systems    N/A  Cardiac Risk Factors include: advanced age (>3men, >80 women);hypertension     Objective:    Today's Vitals   There is no height or weight on file to calculate BMI.  Advanced Directives 10/27/2020  Does Patient Have a Medical Advance Directive? No  Would patient like information on creating a medical advance directive? No - Patient declined    Current Medications (verified) Outpatient Encounter Medications as of 10/27/2020  Medication Sig  . ASPIRIN 81 PO Take 81 mg by mouth daily.   Marland Kitchen atenolol (TENORMIN) 25 MG tablet Take 1 tablet (25 mg total) by mouth daily.  . Calcium Carb-Cholecalciferol (CALCIUM 1000 + D PO) Take by mouth daily.   . clotrimazole-betamethasone (LOTRISONE) cream APPLY TO AFFECTED AREA TWICE A DAY  . GLUCOSAMINE-CHONDROITIN PO Take by mouth daily.  . hydrochlorothiazide (MICROZIDE) 12.5 MG capsule TAKE 1 CAPSULE BY MOUTH EVERY DAY  .  lovastatin (MEVACOR) 20 MG tablet TAKE 1 TABLET BY MOUTH EVERYDAY AT BEDTIME  . meloxicam (MOBIC) 7.5 MG tablet TAKE 1 TABLET BY MOUTH EVERY DAY  . Multiple Vitamin (MULTIVITAMIN ADULT PO) Take by mouth daily.  . vitamin B-12 (CYANOCOBALAMIN) 1000 MCG tablet Take 5,000 mcg by mouth daily.   . [DISCONTINUED] clotrimazole-betamethasone (LOTRISONE) cream Apply 1 application topically 2 (two) times daily as needed.  . [DISCONTINUED] metoprolol tartrate (LOPRESSOR) 100 MG tablet Take 1 tablet (100 mg total) by mouth once for 1 dose. Please take one time dose 100mg  metoprolol tartrate 2 hr prior to cardiac CT for HR control IF HR >55bpm.  . [DISCONTINUED] metoprolol tartrate (LOPRESSOR) 100 MG tablet Take 1 tablet (100 mg total) by mouth once for 1 dose. Please take one time dose 100mg  metoprolol tartrate 2 hr prior to cardiac CT for HR control IF HR >55bpm.  . [DISCONTINUED] metoprolol tartrate (LOPRESSOR) 50 MG tablet Take 1 tablet (50 mg total) by mouth once for 1 dose. Take 2 hours prior to your CT scan.  . [DISCONTINUED] Omega-3 Fatty Acids (FISH OIL) 1200 MG CPDR Take by mouth daily.  (Patient not taking: Reported on 10/27/2020)   No facility-administered encounter medications on file as of 10/27/2020.    Allergies (verified) Sulfa antibiotics   History: Past Medical History:  Diagnosis Date  . Arthritis   . Diabetes mellitus without complication (HCC)    no meds  . High cholesterol   . Hypertension   . Varicose veins of left lower extremity    Past Surgical History:  Procedure Laterality Date  . CATARACT EXTRACTION, BILATERAL    .  COLONOSCOPY    . EYE SURGERY N/A    [employer] 10/27/2020  . TONSILLECTOMY    . TUBAL LIGATION     Family History  Problem Relation Age of Onset  . Colon cancer Neg Hx   . Esophageal cancer Neg Hx   . Rectal cancer Neg Hx   . Stomach cancer Neg Hx    Social History   Socioeconomic History  . Marital status: Married    Spouse name: Not on file  .  Number of children: Not on file  . Years of education: Not on file  . Highest education level: Not on file  Occupational History  . Not on file  Tobacco Use  . Smoking status: Never Smoker  . Smokeless tobacco: Never Used  Vaping Use  . Vaping Use: Never used  Substance and Sexual Activity  . Alcohol use: Yes    Comment: wine occassionally  . Drug use: No  . Sexual activity: Not on file  Other Topics Concern  . Not on file  Social History Narrative  . Not on file   Social Determinants of Health   Financial Resource Strain: Low Risk   . Difficulty of Paying Living Expenses: Not hard at all  Food Insecurity: No Food Insecurity  . Worried About Charity fundraiser in the Last Year: Never true  . Ran Out of Food in the Last Year: Never true  Transportation Needs: No Transportation Needs  . Lack of Transportation (Medical): No  . Lack of Transportation (Non-Medical): No  Physical Activity: Inactive  . Days of Exercise per Week: 0 days  . Minutes of Exercise per Session: 0 min  Stress: No Stress Concern Present  . Feeling of Stress : Not at all  Social Connections: Socially Integrated  . Frequency of Communication with Friends and Family: More than three times a week  . Frequency of Social Gatherings with Friends and Family: More than three times a week  . Attends Religious Services: 1 to 4 times per year  . Active Member of Clubs or Organizations: Yes  . Attends Archivist Meetings: 1 to 4 times per year  . Marital Status: Married    Tobacco Counseling Counseling given: Not Answered   Clinical Intake:  Pre-visit preparation completed: Yes  Pain : No/denies pain     Nutritional Risks: None Diabetes: No  How often do you need to have someone help you when you read instructions, pamphlets, or other written materials from your doctor or pharmacy?: 1 - Never  Diabetic?No   Interpreter Needed?: No  Information entered by :: [place] of  Daily Living In your present state of health, do you have any difficulty performing the following activities: 10/27/2020  Hearing? N  Vision? N  Difficulty concentrating or making decisions? N  Walking or climbing stairs? Y  Comment has sob  Dressing or bathing? N  Doing errands, shopping? N  Preparing Food and eating ? N  Using the Toilet? N  In the past six months, have you accidently leaked urine? Y  Comment has occassional bladder leakage, wears panty liners  Do you have problems with loss of bowel control? N  Managing your Medications? N  Managing your Finances? N  Housekeeping or managing your Housekeeping? N  Some recent data might be hidden    Patient Care Team: Susy Frizzle, MD as PCP - General (Family Medicine) Kate Sable, MD as PCP - Cardiology (Cardiology)  Indicate any recent Medical  Services you may have received from other than [facility] providers in the past year (date may be approximate).     Assessment:   This is a routine wellness examination for Nichole Bryant.  Hearing/Vision screen  Hearing Screening   125Hz  250Hz  500Hz  1000Hz  2000Hz  3000Hz  4000Hz  6000Hz  8000Hz   Right ear:           Left ear:           Vision Screening Comments: Patient states gets eyes examined once per year. Has hx of cataract extractions. Patient wears glasses   Dietary issues and exercise activities discussed: Current Exercise Habits: The patient does not participate in regular exercise at present, Exercise limited by: None identified  Goals    . Exercise 3x per week (30 min per time)    . Prevent falls      Depression Screen PHQ 2/9 Scores 10/27/2020 10/15/2019 07/20/2018 06/07/2017 03/14/2013  PHQ - 2 Score 0 0 0 0 0  PHQ- 9 Score 0 - - - -    Fall Risk Fall Risk  10/27/2020 10/15/2019 07/20/2018 06/07/2017 03/14/2013  Falls in the past year? 0 0 0 No Yes  Number falls in past yr: 0 - - - 1  Injury with Fall? 0 - - - No  Risk for fall due to : Impaired balance/gait - - - -   Follow up Falls evaluation completed;Falls prevention discussed Falls evaluation completed Falls evaluation completed - -    FALL RISK PREVENTION PERTAINING TO THE HOME:  Any stairs in or around the home? Yes  If so, are there any without handrails? No  Home free of loose throw rugs in walkways, pet beds, electrical cords, etc? Yes  Adequate lighting in your home to reduce risk of falls? Yes   ASSISTIVE DEVICES UTILIZED TO PREVENT FALLS:  Life alert? No  Use of a cane, walker or w/c? No  Grab bars in the bathroom? No  Shower chair or bench in shower? Yes  Elevated toilet seat or a handicapped toilet? Yes   Cognitive Function:   Normal cognitive status assessed by direct observation by this Nurse Health Advisor. No abnormalities found.        Immunizations Immunization History  Administered Date(s) Administered  . Fluad Quad(high Dose 65+) 04/24/2020  . Influenza Split 04/25/2013  . Influenza, High Dose Seasonal PF 05/03/2018, 03/31/2019  . Influenza,inj,Quad PF,6+ Mos 04/22/2015, 04/06/2016  . Influenza-Unspecified 05/09/2017, 03/28/2019  . PFIZER Comirnaty(Gray Top)Covid-19 Tri-Sucrose Vaccine 07/29/2020  . PFIZER(Purple Top)SARS-COV-2 Vaccination 09/29/2019, 10/24/2019  . Pneumococcal Conjugate-13 09/22/2015  . Pneumococcal Polysaccharide-23 07/07/2012  . Td 11/01/2000  . Tdap 07/18/2013  . Zoster 02/11/2014  . Zoster Recombinat (Shingrix) [birth year], 12/30/2017    TDAP status: Up to date  Flu Vaccine status: Up to date  Pneumococcal vaccine status: Up to date  Covid-19 vaccine status: Completed vaccines  Qualifies for Shingles Vaccine? Yes   Zostavax completed Yes   Shingrix Completed?: Yes  Screening Tests Health Maintenance  Topic Date Due  . Hepatitis C Screening  Never done  . MAMMOGRAM  10/27/2021 (Originally 07/25/2020)  . INFLUENZA VACCINE  02/02/2021  . TETANUS/TDAP  07/19/2023  . COLONOSCOPY (Pts 45-37yrs Insurance coverage will need to be  confirmed)  12/09/2029  . DEXA SCAN  Completed  . COVID-19 Vaccine  Completed  . PNA vac Low Risk Adult  Completed  . HPV VACCINES  Aged Out    Health Maintenance  Health Maintenance Due  Topic Date Due  . Hepatitis C Screening  Never done    Colorectal cancer screening: No longer required.   Mammogram status: Ordered 10/27/2020. Pt provided with contact info and advised to call to schedule appt.   Bone Density status: Completed 08/31/2018. Results reflect: Bone density results: NORMAL. Repeat every 0 years.  Lung Cancer Screening: (Low Dose CT Chest recommended if Age 84-80 years, 30 pack-year currently smoking OR have quit w/in 15years.) does not qualify.   Lung Cancer Screening Referral: N/A   Additional Screening:  Hepatitis C Screening: does qualify;   Vision Screening: Recommended annual ophthalmology exams for early detection of glaucoma and other disorders of the eye. Is the patient up to date with their annual eye exam?  Yes  Who is the provider or what is the name of the office in which the patient attends annual eye exams? Dr.Lyles  If pt is not established with a provider, would they like to be referred to a provider to establish care? No .   Dental Screening: Recommended annual dental exams for proper oral hygiene  Community Resource Referral / Chronic Care Management: CRR required this visit?  No   CCM required this visit?  No      Plan:     I have personally reviewed and noted the following in the patient's chart:   . Medical and social history . Use of alcohol, tobacco or illicit drugs  . Current medications and supplements . Functional ability and status . Nutritional status . Physical activity . Advanced directives . List of other physicians . Hospitalizations, surgeries, and ER visits in previous 12 months . Vitals . Screenings to include cognitive, depression, and falls . Referrals and appointments  In addition, I have reviewed and  discussed with patient certain preventive protocols, quality metrics, and best practice recommendations. A written personalized care plan for preventive services as well as general preventive health recommendations were provided to patient.     Ofilia Neas, LPN   [id number]   Nurse Notes: None

## 2020-10-27 NOTE — Patient Instructions (Signed)
Nichole Bryant , Thank you for taking time to come for your Medicare Wellness Visit. I appreciate your ongoing commitment to your health goals. Please review the following plan we discussed and let me know if I can assist you in the future.   Screening recommendations/referrals: Colonoscopy: No longer required  Mammogram: Currently due, we have postponed  Bone Density: No longer required  Recommended yearly ophthalmology/optometry visit for glaucoma screening and checkup Recommended yearly dental visit for hygiene and checkup  Vaccinations: Influenza vaccine: Up to date, next due fall 2022  Pneumococcal vaccine: Completed series  Tdap vaccine: Up to date, next due 07/19/2023 Shingles vaccine: Completed series    Advanced directives: Advance directive discussed with you today. Even though you declined this today please call our office should you change your mind and we can give you the proper paperwork for you to fill out.   Conditions/risks identified: None   Next appointment: 10/30/2021 @ 2:45 PM with [place] 65 Years and Older, Female Preventive care refers to lifestyle choices and visits with your health care provider that can promote health and wellness. What does preventive care include?  A yearly physical exam. This is also called an annual well check.  Dental exams once or twice a year.  Routine eye exams. Ask your health care provider how often you should have your eyes checked.  Personal lifestyle choices, including:  Daily care of your teeth and gums.  Regular physical activity.  Eating a healthy diet.  Avoiding tobacco and drug use.  Limiting alcohol use.  Practicing safe sex.  Taking low-dose aspirin every day.  Taking vitamin and mineral supplements as recommended by your health care provider. What happens during an annual well check? The services and screenings done by your health care provider during your annual well  check will depend on your age, overall health, lifestyle risk factors, and family history of disease. Counseling  Your health care provider may ask you questions about your:  Alcohol use.  Tobacco use.  Drug use.  Emotional well-being.  Home and relationship well-being.  Sexual activity.  Eating habits.  History of falls.  Memory and ability to understand (cognition).  Work and work Statistician.  Reproductive health. Screening  You may have the following tests or measurements:  Height, weight, and BMI.  Blood pressure.  Lipid and cholesterol levels. These may be checked every 5 years, or more frequently if you are over 73 years old.  Skin check.  Lung cancer screening. You may have this screening every year starting at age 37 if you have a 30-pack-year history of smoking and currently smoke or have quit within the past 15 years.  Fecal occult blood test (FOBT) of the stool. You may have this test every year starting at age 34.  Flexible sigmoidoscopy or colonoscopy. You may have a sigmoidoscopy every 5 years or a colonoscopy every 10 years starting at age 28.  Hepatitis C blood test.  Hepatitis B blood test.  Sexually transmitted disease (STD) testing.  Diabetes screening. This is done by checking your blood sugar (glucose) after you have not eaten for a while (fasting). You may have this done every 1-3 years.  Bone density scan. This is done to screen for osteoporosis. You may have this done starting at age 36.  Mammogram. This may be done every 1-2 years. Talk to your health care provider about how often you should have regular mammograms. Talk with your health care provider  about your test results, treatment options, and if necessary, the need for more tests. Vaccines  Your health care provider may recommend certain vaccines, such as:  Influenza vaccine. This is recommended every year.  Tetanus, diphtheria, and acellular pertussis (Tdap, Td) vaccine. You  may need a Td booster every 10 years.  Zoster vaccine. You may need this after age 40.  Pneumococcal 13-valent conjugate (PCV13) vaccine. One dose is recommended after age 65.  Pneumococcal polysaccharide (PPSV23) vaccine. One dose is recommended after age 52. Talk to your health care provider about which screenings and vaccines you need and how often you need them. This information is not intended to replace advice given to you by your health care provider. Make sure you discuss any questions you have with your health care provider. Document Released: 07/18/2015 Document Revised: 03/10/2016 Document Reviewed: 04/22/2015 Elsevier Interactive Patient Education  2017 [place] Prevention in the Home Falls can cause injuries. They can happen to people of all ages. There are many things you can do to make your home safe and to help prevent falls. What can I do on the outside of my home?  Regularly fix the edges of walkways and driveways and fix any cracks.  Remove anything that might make you trip as you walk through a door, such as a raised step or threshold.  Trim any bushes or trees on the path to your home.  Use bright outdoor lighting.  Clear any walking paths of anything that might make someone trip, such as rocks or tools.  Regularly check to see if handrails are loose or broken. Make sure that both sides of any steps have handrails.  Any raised decks and porches should have guardrails on the edges.  Have any leaves, snow, or ice cleared regularly.  Use sand or salt on walking paths during winter.  Clean up any spills in your garage right away. This includes oil or grease spills. What can I do in the bathroom?  Use night lights.  Install grab bars by the toilet and in the tub and shower. Do not use towel bars as grab bars.  Use non-skid mats or decals in the tub or shower.  If you need to sit down in the shower, use a plastic, non-slip stool.  Keep the floor  dry. Clean up any water that spills on the floor as soon as it happens.  Remove soap buildup in the tub or shower regularly.  Attach bath mats securely with double-sided non-slip rug tape.  Do not have throw rugs and other things on the floor that can make you trip. What can I do in the bedroom?  Use night lights.  Make sure that you have a light by your bed that is easy to reach.  Do not use any sheets or blankets that are too big for your bed. They should not hang down onto the floor.  Have a firm chair that has side arms. You can use this for support while you get dressed.  Do not have throw rugs and other things on the floor that can make you trip. What can I do in the kitchen?  Clean up any spills right away.  Avoid walking on wet floors.  Keep items that you use a lot in easy-to-reach places.  If you need to reach something above you, use a strong step stool that has a grab bar.  Keep electrical cords out of the way.  Do not use floor polish or  wax that makes floors slippery. If you must use wax, use non-skid floor wax.  Do not have throw rugs and other things on the floor that can make you trip. What can I do with my stairs?  Do not leave any items on the stairs.  Make sure that there are handrails on both sides of the stairs and use them. Fix handrails that are broken or loose. Make sure that handrails are as long as the stairways.  Check any carpeting to make sure that it is firmly attached to the stairs. Fix any carpet that is loose or worn.  Avoid having throw rugs at the top or bottom of the stairs. If you do have throw rugs, attach them to the floor with carpet tape.  Make sure that you have a light switch at the top of the stairs and the bottom of the stairs. If you do not have them, ask someone to add them for you. What else can I do to help prevent falls?  Wear shoes that:  Do not have high heels.  Have rubber bottoms.  Are comfortable and fit you  well.  Are closed at the toe. Do not wear sandals.  If you use a stepladder:  Make sure that it is fully opened. Do not climb a closed stepladder.  Make sure that both sides of the stepladder are locked into place.  Ask someone to hold it for you, if possible.  Clearly mark and make sure that you can see:  Any grab bars or handrails.  First and last steps.  Where the edge of each step is.  Use tools that help you move around (mobility aids) if they are needed. These include:  Canes.  Walkers.  Scooters.  Crutches.  Turn on the lights when you go into a dark area. Replace any light bulbs as soon as they burn out.  Set up your furniture so you have a clear path. Avoid moving your furniture around.  If any of your floors are uneven, fix them.  If there are any pets around you, be aware of where they are.  Review your medicines with your doctor. Some medicines can make you feel dizzy. This can increase your chance of falling. Ask your doctor what other things that you can do to help prevent falls. This information is not intended to replace advice given to you by your health care provider. Make sure you discuss any questions you have with your health care provider. Document Released: 04/17/2009 Document Revised: 11/27/2015 Document Reviewed: 07/26/2014 Elsevier Interactive Patient Education  2017 [employer].

## 2020-11-10 ENCOUNTER — Other Ambulatory Visit: Payer: Self-pay | Admitting: Family Medicine

## 2020-11-17 ENCOUNTER — Other Ambulatory Visit: Payer: Self-pay | Admitting: Family Medicine

## 2020-11-19 ENCOUNTER — Telehealth: Payer: Self-pay | Admitting: Family Medicine

## 2020-11-19 ENCOUNTER — Other Ambulatory Visit: Payer: Self-pay | Admitting: *Deleted

## 2020-11-19 MED ORDER — ATENOLOL 25 MG PO TABS
25.0000 mg | ORAL_TABLET | Freq: Every day | ORAL | 0 refills | Status: DC
Start: 2020-11-19 — End: 2021-08-11

## 2020-11-19 MED ORDER — HYDROCHLOROTHIAZIDE 12.5 MG PO CAPS: ORAL_CAPSULE | ORAL | 0 refills | Status: DC

## 2020-11-19 MED ORDER — MELOXICAM 7.5 MG PO TABS
7.5000 mg | ORAL_TABLET | Freq: Every day | ORAL | 0 refills | Status: DC
Start: 2020-11-19 — End: 2020-12-15

## 2020-11-19 NOTE — Telephone Encounter (Signed)
Medication filled x1 with no refills.   Requires office visit before any further refills can be given.   Letter sent.  

## 2020-11-19 NOTE — Telephone Encounter (Signed)
Patient requesting refill on hydrochlorothiazide (MICROZIDE) 12.5 MG capsule She currently uses [facility] she is going out of town on Sunday and she has enough pills for 6 days.  CB# [phone]

## 2020-11-21 ENCOUNTER — Other Ambulatory Visit: Payer: Self-pay | Admitting: *Deleted

## 2020-11-21 MED ORDER — LOVASTATIN 20 MG PO TABS: ORAL_TABLET | ORAL | 0 refills | Status: DC

## 2020-12-10 ENCOUNTER — Other Ambulatory Visit: Payer: Self-pay

## 2020-12-10 ENCOUNTER — Other Ambulatory Visit: Payer: Medicare PPO

## 2020-12-10 DIAGNOSIS — Z13220 Encounter for screening for lipoid disorders: Secondary | ICD-10-CM | POA: Diagnosis not present

## 2020-12-10 DIAGNOSIS — I10 Essential (primary) hypertension: Secondary | ICD-10-CM | POA: Diagnosis not present

## 2020-12-10 DIAGNOSIS — E7800 Pure hypercholesterolemia, unspecified: Secondary | ICD-10-CM | POA: Diagnosis not present

## 2020-12-10 DIAGNOSIS — R7303 Prediabetes: Secondary | ICD-10-CM

## 2020-12-10 DIAGNOSIS — Z136 Encounter for screening for cardiovascular disorders: Secondary | ICD-10-CM | POA: Diagnosis not present

## 2020-12-10 DIAGNOSIS — E559 Vitamin D deficiency, unspecified: Secondary | ICD-10-CM | POA: Diagnosis not present

## 2020-12-10 DIAGNOSIS — Z1159 Encounter for screening for other viral diseases: Secondary | ICD-10-CM | POA: Diagnosis not present

## 2020-12-11 LAB — CBC WITH DIFFERENTIAL/PLATELET
Absolute Monocytes: 592 cells/uL (ref 200–950)
Basophils Absolute: 50 cells/uL (ref 0–200)
Basophils Relative: 0.8 %
Eosinophils Absolute: 139 cells/uL (ref 15–500)
Eosinophils Relative: 2.2 %
HCT: 42.4 % (ref 35.0–45.0)
Hemoglobin: 13.7 g/dL (ref 11.7–15.5)
Lymphs Abs: 1751 cells/uL (ref 850–3900)
MCH: 28.4 pg (ref 27.0–33.0)
MCHC: 32.3 g/dL (ref 32.0–36.0)
MCV: 88 fL (ref 80.0–100.0)
MPV: 10.8 fL (ref 7.5–12.5)
Monocytes Relative: 9.4 %
Neutro Abs: 3767 cells/uL (ref 1500–7800)
Neutrophils Relative %: 59.8 %
Platelets: 198 10*3/uL (ref 140–400)
RBC: 4.82 10*6/uL (ref 3.80–5.10)
RDW: 12.5 % (ref 11.0–15.0)
Total Lymphocyte: 27.8 %
WBC: 6.3 10*3/uL (ref 3.8–10.8)

## 2020-12-11 LAB — COMPLETE METABOLIC PANEL WITH GFR
AG Ratio: 1.9 (calc) (ref 1.0–2.5)
ALT: 19 U/L (ref 6–29)
AST: 21 U/L (ref 10–35)
Albumin: 4.2 g/dL (ref 3.6–5.1)
Alkaline phosphatase (APISO): 66 U/L (ref 37–153)
BUN: 21 mg/dL (ref 7–25)
CO2: 23 mmol/L (ref 20–32)
Calcium: 9.6 mg/dL (ref 8.6–10.4)
Chloride: 101 mmol/L (ref 98–110)
Creat: 0.8 mg/dL (ref 0.60–0.93)
GFR, Est African American: 84 mL/min/{1.73_m2} (ref >=60)
GFR, Est Non African American: 72 mL/min/{1.73_m2} (ref >=60)
Globulin: 2.2 g/dL (calc) (ref 1.9–3.7)
Glucose, Bld: 121 mg/dL — ABNORMAL HIGH (ref 65–99)
Potassium: 4.2 mmol/L (ref 3.5–5.3)
Sodium: 139 mmol/L (ref 135–146)
Total Bilirubin: 0.5 mg/dL (ref 0.2–1.2)
Total Protein: 6.4 g/dL (ref 6.1–8.1)

## 2020-12-11 LAB — LIPID PANEL
Cholesterol: 182 mg/dL (ref <200)
HDL: 52 mg/dL (ref >=50)
LDL Cholesterol (Calc): 103 mg/dL (calc) — ABNORMAL HIGH
Non-HDL Cholesterol (Calc): 130 mg/dL (calc) — ABNORMAL HIGH (ref <130)
Total CHOL/HDL Ratio: 3.5 (calc) (ref <5.0)
Triglycerides: 154 mg/dL — ABNORMAL HIGH (ref <150)

## 2020-12-11 LAB — MICROALBUMIN / CREATININE URINE RATIO
Creatinine, Urine: 66 mg/dL (ref 20–275)
Microalb Creat Ratio: 8 mcg/mg creat (ref <30)
Microalb, Ur: 0.5 mg/dL

## 2020-12-11 LAB — VITAMIN D 25 HYDROXY (VIT D DEFICIENCY, FRACTURES): Vit D, 25-Hydroxy: 37 ng/mL (ref 30–100)

## 2020-12-11 LAB — HEPATITIS C ANTIBODY
Hepatitis C Ab: NONREACTIVE
SIGNAL TO CUT-OFF: 0.01 (ref <1.00)

## 2020-12-11 LAB — HEMOGLOBIN A1C
Hgb A1c MFr Bld: 6 % of total Hgb — ABNORMAL HIGH (ref <5.7)
Mean Plasma Glucose: 126 mg/dL
eAG (mmol/L): 7 mmol/L

## 2020-12-12 ENCOUNTER — Other Ambulatory Visit: Payer: Self-pay | Admitting: Family Medicine

## 2020-12-15 ENCOUNTER — Ambulatory Visit: Payer: Medicare PPO | Admitting: Family Medicine

## 2020-12-15 ENCOUNTER — Encounter: Payer: Self-pay | Admitting: Family Medicine

## 2020-12-15 ENCOUNTER — Other Ambulatory Visit: Payer: Self-pay

## 2020-12-15 VITALS — BP 134/68 | HR 84 | Temp 98.2°F | Resp 16 | Ht 63.0 in | Wt 174.0 lb

## 2020-12-15 DIAGNOSIS — E7800 Pure hypercholesterolemia, unspecified: Secondary | ICD-10-CM | POA: Diagnosis not present

## 2020-12-15 DIAGNOSIS — I10 Essential (primary) hypertension: Secondary | ICD-10-CM

## 2020-12-15 DIAGNOSIS — R7303 Prediabetes: Secondary | ICD-10-CM

## 2020-12-15 MED ORDER — CLOTRIMAZOLE-BETAMETHASONE 1-0.05 % EX CREA
TOPICAL_CREAM | CUTANEOUS | 0 refills | Status: DC
Start: 2020-12-15 — End: 2022-11-12

## 2020-12-15 NOTE — Progress Notes (Signed)
Subjective:    Patient ID: Nichole Bryant, female    DOB: 05/22/1946, 75 y.o.   MRN: [id number]  HPI  Patient is here today for a follow-up of her chronic medical conditions.  She has a history of hypertension, prediabetes, hyperlipidemia.  Her most recent lab work is listed below. Lab on 12/10/2020  Component Date Value Ref Range Status   WBC 12/10/2020 6.3  3.8 - 10.8 Thousand/uL Final   RBC 12/10/2020 4.82  3.80 - 5.10 Million/uL Final   Hemoglobin 12/10/2020 13.7  11.7 - 15.5 g/dL Final   HCT 12/10/2020 42.4  35.0 - 45.0 % Final   MCV 12/10/2020 88.0  80.0 - 100.0 fL Final   MCH 12/10/2020 28.4  27.0 - 33.0 pg Final   MCHC 12/10/2020 32.3  32.0 - 36.0 g/dL Final   RDW 12/10/2020 12.5  11.0 - 15.0 % Final   Platelets 12/10/2020 198  140 - 400 Thousand/uL Final   MPV 12/10/2020 10.8  7.5 - 12.5 fL Final   Neutro Abs 12/10/2020 3,767  1,500 - 7,800 cells/uL Final   Lymphs Abs 12/10/2020 1,751  850 - 3,900 cells/uL Final   Absolute Monocytes 12/10/2020 592  200 - 950 cells/uL Final   Eosinophils Absolute 12/10/2020 139  15 - 500 cells/uL Final   Basophils Absolute 12/10/2020 50  0 - 200 cells/uL Final   Neutrophils Relative % 12/10/2020 59.8  % Final   Total Lymphocyte 12/10/2020 27.8  % Final   Monocytes Relative 12/10/2020 9.4  % Final   Eosinophils Relative 12/10/2020 2.2  % Final   Basophils Relative 12/10/2020 0.8  % Final   Glucose, Bld 12/10/2020 121 (A) 65 - 99 mg/dL Final   Comment: .            Fasting reference interval . For someone without known diabetes, a glucose value between 100 and 125 mg/dL is consistent with prediabetes and should be confirmed with a follow-up test. .    BUN 12/10/2020 21  7 - 25 mg/dL Final   Creat 12/10/2020 0.80  0.60 - 0.93 mg/dL Final   Comment: For patients >74 years of age, the reference limit for Creatinine is approximately 13% higher for people identified as African-American. .    GFR, Est Non African American  12/10/2020 72  > OR = 60 mL/min/1.4m2 Final   GFR, Est African American 12/10/2020 84  > OR = 60 mL/min/1.38m2 Final   BUN/Creatinine Ratio 71/69/6789 NOT APPLICABLE  6 - 22 (calc) Final   Sodium 12/10/2020 139  135 - 146 mmol/L Final   Potassium 12/10/2020 4.2  3.5 - 5.3 mmol/L Final   Chloride 12/10/2020 101  98 - 110 mmol/L Final   CO2 12/10/2020 23  20 - 32 mmol/L Final   Calcium 12/10/2020 9.6  8.6 - 10.4 mg/dL Final   Total Protein 12/10/2020 6.4  6.1 - 8.1 g/dL Final   Albumin 12/10/2020 4.2  3.6 - 5.1 g/dL Final   Globulin 12/10/2020 2.2  1.9 - 3.7 g/dL (calc) Final   AG Ratio 12/10/2020 1.9  1.0 - 2.5 (calc) Final   Total Bilirubin 12/10/2020 0.5  0.2 - 1.2 mg/dL Final   Alkaline phosphatase (APISO) 12/10/2020 66  37 - 153 U/L Final   AST 12/10/2020 21  10 - 35 U/L Final   ALT 12/10/2020 19  6 - 29 U/L Final   Hgb A1c MFr Bld 12/10/2020 6.0 (A) <5.7 % of total Hgb Final   Comment: For someone  without known diabetes, a hemoglobin  A1c value between 5.7% and 6.4% is consistent with prediabetes and should be confirmed with a  follow-up test. . For someone with known diabetes, a value <7% indicates that their diabetes is well controlled. A1c targets should be individualized based on duration of diabetes, age, comorbid conditions, and other considerations. . This assay result is consistent with an increased risk of diabetes. . Currently, no consensus exists regarding use of hemoglobin A1c for diagnosis of diabetes for children. .    Mean Plasma Glucose 12/10/2020 126  mg/dL Final   eAG (mmol/L) 12/10/2020 7.0  mmol/L Final   Hepatitis C Ab 12/10/2020 NON-REACTIVE  NON-REACTIVE Final   SIGNAL TO CUT-OFF 12/10/2020 0.01  <1.00 Final   Comment: . HCV antibody was non-reactive. There is no laboratory  evidence of HCV infection. . In most cases, no further action is required. However, if recent HCV exposure is suspected, a test for HCV RNA (test code [phone]) is  suggested. . For additional information please refer to http://education.[facility].com/faq/FAQ22v1 (This link is being provided for informational/ educational purposes only.) .    Cholesterol 12/10/2020 182  <200 mg/dL Final   HDL 12/10/2020 52  > OR = 50 mg/dL Final   Triglycerides 12/10/2020 154 (A) <150 mg/dL Final   LDL Cholesterol (Calc) 12/10/2020 103 (A) mg/dL (calc) Final   Comment: Reference range: <100 . Desirable range <100 mg/dL for primary prevention;   <70 mg/dL for patients with CHD or diabetic patients  with > or = 2 CHD risk factors. Marland Kitchen LDL-C is now calculated using the Martin-Hopkins  calculation, which is a validated novel method providing  better accuracy than the Friedewald equation in the  estimation of LDL-C.  Cresenciano Genre et al. Annamaria Helling. 3614;431(54): 2061-2068  (http://education.[facility].com/faq/FAQ164)    Total CHOL/HDL Ratio 12/10/2020 3.5  <5.0 (calc) Final   Non-HDL Cholesterol (Calc) 12/10/2020 130 (A) <130 mg/dL (calc) Final   Comment: For patients with diabetes plus 1 major ASCVD risk  factor, treating to a non-HDL-C goal of <100 mg/dL  (LDL-C of <70 mg/dL) is considered a therapeutic  option.    Creatinine, Urine 12/10/2020 66  20 - 275 mg/dL Final   Microalb, Ur 12/10/2020 0.5  mg/dL Final   Comment: Reference Range Not established    Microalb Creat Ratio 12/10/2020 8  <30 mcg/mg creat Final   Comment: . The ADA defines abnormalities in albumin excretion as follows: Marland Kitchen Albuminuria Category        Result (mcg/mg creatinine) . Normal to Mildly increased   <30 Moderately increased         30-299  Severely increased           > OR = 300 . The ADA recommends that at least two of three specimens collected within a 3-6 month period be abnormal before considering a patient to be within a diagnostic category.    Vit D, 25-Hydroxy 12/10/2020 37  30 - 100 ng/mL Final   Comment: Vitamin D Status         25-OH Vitamin  D: . Deficiency:                    <20 ng/mL Insufficiency:             20 - 29 ng/mL Optimal:                 > or = 30 ng/mL . For 25-OH Vitamin D testing on patients on  D2-supplementation  and patients for whom quantitation  of D2 and D3 fractions is required, the [facility]ure[facility](TM) 25-OH VIT D, (D2,D3), LC/MS/MS is recommended: order  code [id number] (patients >71yrs). See Note 1 . Note 1 . For additional information, please refer to  http://education.[facility].com/faq/FAQ199  (This link is being provided for informational/ educational purposes only.)    She denies any chest pain shortness of breath or dyspnea on exertion.  She does have profound kyphosis in the cervical spine.  She also has chronic low back pain secondary to her chronic deformity and positioning.  I recommended that she wear a back brace when she is standing for prolonged periods of time to help take some of the pressure and pain of the muscles in her back.  She is also complaining of painful varicose veins in both lower extremities.  She has numerous large swollen varicose veins but no erythema or warmth or swelling.  She is not wearing compression hose.  She is not taking horse Chestnut seed extract. Past Medical History:  Diagnosis Date   Arthritis    Diabetes mellitus without complication (HCC)    no meds   High cholesterol    Hypertension    Varicose veins of left lower extremity    Past Surgical History:  Procedure Laterality Date   CATARACT EXTRACTION, BILATERAL     COLONOSCOPY     EYE SURGERY N/A    [employer] 10/27/2020   TONSILLECTOMY     TUBAL LIGATION     Current Outpatient Medications on File Prior to Visit  Medication Sig Dispense Refill   ASPIRIN 81 PO Take 81 mg by mouth daily.      atenolol (TENORMIN) 25 MG tablet Take 1 tablet (25 mg total) by mouth daily. 90 tablet 0   Calcium Carb-Cholecalciferol (CALCIUM 1000 + D PO) Take by mouth daily.      hydrochlorothiazide (MICROZIDE) 12.5 MG  capsule TAKE 1 CAPSULE BY MOUTH EVERY DAY 90 capsule 0   lovastatin (MEVACOR) 20 MG tablet TAKE 1 TABLET BY MOUTH EVERYDAY AT BEDTIME 90 tablet 0   Multiple Vitamin (MULTIVITAMIN ADULT PO) Take by mouth daily.     Omega-3 Fatty Acids (FISH OIL) 1000 MG CAPS Take by mouth.     vitamin B-12 (CYANOCOBALAMIN) 1000 MCG tablet Take 5,000 mcg by mouth daily.      Zinc Sulfate (ZINC-220 PO) Take by mouth.     No current facility-administered medications on file prior to visit.   Allergies  Allergen Reactions   Sulfa Antibiotics     Hives, made ears swell   Social History   Socioeconomic History   Marital status: Married    Spouse name: Not on file   Number of children: Not on file   Years of education: Not on file   Highest education level: Not on file  Occupational History   Not on file  Tobacco Use   Smoking status: Never   Smokeless tobacco: Never  Vaping Use   Vaping Use: Never used  Substance and Sexual Activity   Alcohol use: Yes    Comment: wine occassionally   Drug use: No   Sexual activity: Not on file  Other Topics Concern   Not on file  Social History Narrative   Not on file   Social Determinants of Health   Financial Resource Strain: Low Risk    Difficulty of Paying Living Expenses: Not hard at all  Food Insecurity: No Food Insecurity   Worried About [place] in the  Last Year: Never true   Ran Out of Food in the Last Year: Never true  Transportation Needs: No Transportation Needs   Lack of Transportation (Medical): No   Lack of Transportation (Non-Medical): No  Physical Activity: Inactive   Days of Exercise per Week: 0 days   Minutes of Exercise per Session: 0 min  Stress: No Stress Concern Present   Feeling of Stress : Not at all  Social Connections: Socially Integrated   Frequency of Communication with Friends and Family: More than three times a week   Frequency of Social Gatherings with Friends and Family: More than three times a week    Attends Religious Services: 1 to 4 times per year   Active Member of [employer] or Organizations: Yes   Attends Archivist Meetings: 1 to 4 times per year   Marital Status: Married  Human resources officer Violence: Not At Risk   Fear of Current or Ex-Partner: No   Emotionally Abused: No   Physically Abused: No   Sexually Abused: No   Family History  Problem Relation Age of Onset   Colon cancer Neg Hx    Esophageal cancer Neg Hx    Rectal cancer Neg Hx    Stomach cancer Neg Hx       Review of Systems     Objective:   Physical Exam Cardiovascular:     Rate and Rhythm: Regular rhythm.     Heart sounds: Normal heart sounds. No murmur heard.   No friction rub. No gallop.  Pulmonary:     Effort: Pulmonary effort is normal. No respiratory distress.     Breath sounds: Normal breath sounds. No stridor. No wheezing, rhonchi or rales.  Abdominal:     General: Abdomen is flat. Bowel sounds are normal. There is no distension.     Palpations: Abdomen is soft.     Tenderness: There is no abdominal tenderness. There is no guarding.  Musculoskeletal:     Cervical back: Neck supple.  Skin:    Findings: No erythema or rash.   Patient has pronounced cervical kyphosis. She also has numerous verrucous veins in both legs as well as a large varicosity just superior to the right medial malleolus       Assessment & Plan:  Essential hypertension  Pure hypercholesterolemia  Prediabetes Reviewed her lab work.  Recommended a low carbohydrate diet and regular aerobic exercises.  Recommended a T LSO brace when she is standing for prolonged period of time for comfort.  Recommended compression hose and 600 mg daily of horse Chestnut seed extract for varicose veins in the lower legs.  Blood pressure and cholesterol are acceptable.

## 2021-02-02 ENCOUNTER — Other Ambulatory Visit: Payer: Self-pay | Admitting: Family Medicine

## 2021-02-16 ENCOUNTER — Telehealth: Payer: Self-pay | Admitting: *Deleted

## 2021-02-16 ENCOUNTER — Other Ambulatory Visit: Payer: Self-pay | Admitting: Family Medicine

## 2021-02-16 MED ORDER — AZITHROMYCIN 250 MG PO TABS: ORAL_TABLET | ORAL | 0 refills | Status: DC

## 2021-02-16 NOTE — Telephone Encounter (Signed)
Received call from patient.   Reports that Sx started >1 week prior and include non-productive cough, nasal drainage (yellow to green colored mucus), and chest congestion.  Advised to continue symptom management with OTC medications: Tylenol/ Ibuprofen for fever/ body aches, Mucinex/ Delsym for cough/ chest congestion, Afrin/Sudafed/nasal saline for sinus pressure/ nasal congestion.  Requested Z-pack since Sx have been lingering >1 week. Please advise.

## 2021-02-16 NOTE — Telephone Encounter (Signed)
Call placed to patient and patient made aware.  

## 2021-02-23 ENCOUNTER — Telehealth (INDEPENDENT_AMBULATORY_CARE_PROVIDER_SITE_OTHER): Payer: Medicare PPO | Admitting: Nurse Practitioner

## 2021-02-23 ENCOUNTER — Other Ambulatory Visit: Payer: Self-pay

## 2021-02-23 DIAGNOSIS — J029 Acute pharyngitis, unspecified: Secondary | ICD-10-CM

## 2021-02-23 DIAGNOSIS — R059 Cough, unspecified: Secondary | ICD-10-CM

## 2021-02-23 MED ORDER — FLUTICASONE PROPIONATE 50 MCG/ACT NA SUSP: 2.0000 | Freq: Every day | NASAL | 6 refills | Status: DC

## 2021-02-23 NOTE — Progress Notes (Signed)
Subjective:    Patient ID: Nichole Bryant, female    DOB: [birth year], 75 y.o.   MRN: [id number]  HPI: Nichole Bryant is a 75 y.o. female presenting virtually for sore throat.  Chief Complaint  Patient presents with   Sore Throat   UPPER RESPIRATORY TRACT INFECTION Onset: ~10 days -reports symptoms are getting better until yesterday when she developed a sore throat. COVID-19 testing history: 2 at home tests negative COVID-19 vaccination status: 2 covid, 1 booster Fever: no Cough: yes Shortness of breath: no Wheezing: no Chest pain: no Chest tightness: no Chest congestion: yes Nasal congestion: yes Runny nose: no Post nasal drip: yes Sneezing: no Sore throat: yes Swollen glands: yes Sinus pressure: no Headache: no Face pain: no Toothache: no Ear pain: no  Ear pressure: no  Eyes red/itching:no Eye drainage/crusting: no  Nausea: no  Vomiting: no Diarrhea: no  Change in appetite: yes  Loss of taste/smell: no  Rash: no Fatigue: yes Sick contacts: yes Strep contacts: no  Context: worse Recurrent sinusitis: no Treatments attempted: cold medicine, Zpack,  Relief with OTC medications: yes  Allergies  Allergen Reactions   Sulfa Antibiotics     Hives, made ears swell    Outpatient Encounter Medications as of 02/23/2021  Medication Sig   ASPIRIN 81 PO Take 81 mg by mouth daily.    atenolol (TENORMIN) 25 MG tablet Take 1 tablet (25 mg total) by mouth daily.   azithromycin (ZITHROMAX) 250 MG tablet 2 tabs poqday1, 1 tab poqday 2-5   Calcium Carb-Cholecalciferol (CALCIUM 1000 + D PO) Take by mouth daily.    clotrimazole-betamethasone (LOTRISONE) cream APPLY TO AFFECTED AREA TWICE A DAY   hydrochlorothiazide (MICROZIDE) 12.5 MG capsule TAKE 1 CAPSULE EVERY DAY   lovastatin (MEVACOR) 20 MG tablet TAKE 1 TABLET BY MOUTH EVERYDAY AT BEDTIME   Multiple Vitamin (MULTIVITAMIN ADULT PO) Take by mouth daily.   Omega-3 Fatty Acids (FISH OIL) 1000 MG CAPS Take  by mouth.   vitamin B-12 (CYANOCOBALAMIN) 1000 MCG tablet Take 5,000 mcg by mouth daily.    Zinc Sulfate (ZINC-220 PO) Take by mouth.   No facility-administered encounter medications on file as of 02/23/2021.    Patient Active Problem List   Diagnosis Date Noted   Pre-diabetes 10/29/2014   Allergic reaction 03/15/2013   LOM (left otitis media) 03/15/2013   Ear pain 03/15/2013   High cholesterol    Hypertension     Past Medical History:  Diagnosis Date   Arthritis    Diabetes mellitus without complication (HCC)    no meds   High cholesterol    Hypertension    Varicose veins of left lower extremity     Relevant past medical, surgical, family and social history reviewed and updated as indicated. Interim medical history since our last visit reviewed.  Review of Systems Per HPI unless specifically indicated above     Objective:    There were no vitals taken for this visit.  Wt Readings from Last 3 Encounters:  12/15/20 174 lb (78.9 kg)  05/08/20 172 lb (78 kg)  03/24/20 172 lb (78 kg)    Physical Exam Vitals and nursing note reviewed.  Constitutional:      General: She is not in acute distress.    Appearance: Normal appearance. She is not ill-appearing, toxic-appearing or diaphoretic.  HENT:     Head: Normocephalic and atraumatic.     Right Ear: External ear normal.     Left Ear: External ear  normal.     Nose: Nose normal. No congestion or rhinorrhea.     Mouth/Throat:     Mouth: Mucous membranes are moist.     Pharynx: Oropharynx is clear. Uvula midline. Posterior oropharyngeal erythema present.  Eyes:     General: No scleral icterus.       Right eye: No discharge.        Left eye: No discharge.  Cardiovascular:     Comments: Unable to assess heart sounds via virtual visit. Pulmonary:     Effort: Pulmonary effort is normal. No respiratory distress.     Comments: Unable to assess lung sounds via virtual visit.  Patient talking in complete sentences during  telemedicine visit.  No accessory muscle use. Skin:    Coloration: Skin is not jaundiced or pale.     Findings: No erythema.  Neurological:     Mental Status: She is alert and oriented to person, place, and time.     Motor: No weakness.  Psychiatric:        Mood and Affect: Mood normal.        Behavior: Behavior normal.        Thought Content: Thought content normal.        Judgment: Judgment normal.      Assessment & Plan:  1. Sore throat Acute.  Upper respiratory symptoms have been going on for greater than 1 week, but have improved.  Sore throat is new symptom.  We will check rapid strep with culture, swab for COVID, start Flonase in the meantime for possible postnasal drip.  Push fluids-hydrate with water or Pedialyte.  Can use throat numbing spray or numbing lozenges to help with sore throat.  Follow-up pending results.  - STREP GROUP A AG, W/REFLEX TO CULT - SARS-COV-2 RNA,(COVID-19) QUAL NAAT - fluticasone (FLONASE) 50 MCG/ACT nasal spray; Place 2 sprays into both nostrils daily.  Dispense: 16 g; Refill: 6  2. Cough Acute and improving, however worse at nighttime.  I do not think that she has pneumonia-no pain or difficulty breathing, fatigue, fevers.  I think most likely, she has a postviral cough treat with Tessalon Perles and monitor over the next few days.  If symptoms do not improve or worsen, we can get a chest x-ray.  She was treated with azithromycin over the weekend  - benzonatate (TESSALON) 100 MG capsule; Take 1 capsule (100 mg total) by mouth at bedtime as needed for cough. Take at night time and monitor for drowsiness.  If this medication makes you drowsy, do not take while driving or operating heavy machinery.  Dispense: 20 capsule; Refill: 0    Follow up plan: No follow-ups on file.    Due to the catastrophic nature of the COVID-19 pandemic, this video visit was completed soley via audio and visual contact via [facility] due to the restrictions of the COVID-19  pandemic.  All issues as above were discussed and addressed. Physical exam was done as above through visual confirmation on [facility]. If it was felt that the patient should be evaluated in the office, they were directed there. The patient verbally consented to this visit. Location of the patient: home Location of the provider: work Those involved with this call:  Provider: Noemi Chapel, DNP, FNP-C CMA: n/a Front Desk/Registration: Vevelyn Pat  Time spent on call:  11 minutes with patient face to face via video conference. More than 50% of this time was spent in counseling and coordination of care. 15 minutes total spent in  review of patient's record and preparation of their chart. I verified patient identity using two factors (patient name and date of birth). Patient consents verbally to being seen via telemedicine visit today.

## 2021-02-24 LAB — SARS-COV-2 RNA,(COVID-19) QUALITATIVE NAAT: SARS CoV2 RNA: NOT DETECTED

## 2021-02-25 LAB — CULTURE, GROUP A STREP
MICRO NUMBER:: 12273110
SPECIMEN QUALITY:: ADEQUATE

## 2021-02-25 LAB — STREP GROUP A AG, W/REFLEX TO CULT: Streptococcus Group A AG: NOT DETECTED

## 2021-02-25 MED ORDER — BENZONATATE 100 MG PO CAPS
100.0000 mg | ORAL_CAPSULE | Freq: Every evening | ORAL | 0 refills | Status: DC | PRN
Start: 2021-02-25 — End: 2021-04-20

## 2021-02-26 ENCOUNTER — Encounter: Payer: Self-pay | Admitting: Nurse Practitioner

## 2021-04-20 ENCOUNTER — Other Ambulatory Visit: Payer: Self-pay | Admitting: Nurse Practitioner

## 2021-04-20 ENCOUNTER — Other Ambulatory Visit: Payer: Self-pay | Admitting: Family Medicine

## 2021-04-20 DIAGNOSIS — R059 Cough, unspecified: Secondary | ICD-10-CM

## 2021-06-15 ENCOUNTER — Other Ambulatory Visit: Payer: Self-pay

## 2021-06-15 MED ORDER — LOVASTATIN 20 MG PO TABS: ORAL_TABLET | ORAL | 3 refills | Status: DC

## 2021-06-29 ENCOUNTER — Other Ambulatory Visit: Payer: Self-pay | Admitting: Family Medicine

## 2021-08-01 ENCOUNTER — Other Ambulatory Visit: Payer: Self-pay | Admitting: Family Medicine

## 2021-08-01 ENCOUNTER — Other Ambulatory Visit: Payer: Self-pay | Admitting: Nurse Practitioner

## 2021-08-01 DIAGNOSIS — J029 Acute pharyngitis, unspecified: Secondary | ICD-10-CM

## 2021-08-01 DIAGNOSIS — R059 Cough, unspecified: Secondary | ICD-10-CM

## 2021-08-03 ENCOUNTER — Other Ambulatory Visit: Payer: Self-pay

## 2021-08-10 ENCOUNTER — Other Ambulatory Visit: Payer: Self-pay | Admitting: Family Medicine

## 2021-08-11 ENCOUNTER — Other Ambulatory Visit: Payer: Self-pay

## 2021-08-11 MED ORDER — ATENOLOL 25 MG PO TABS: 25.0000 mg | ORAL_TABLET | Freq: Every day | ORAL | 1 refills | Status: DC

## 2021-09-10 DIAGNOSIS — H524 Presbyopia: Secondary | ICD-10-CM | POA: Diagnosis not present

## 2021-09-10 DIAGNOSIS — H04123 Dry eye syndrome of bilateral lacrimal glands: Secondary | ICD-10-CM | POA: Diagnosis not present

## 2021-09-10 DIAGNOSIS — H18592 Other hereditary corneal dystrophies, left eye: Secondary | ICD-10-CM | POA: Diagnosis not present

## 2021-09-10 DIAGNOSIS — Z961 Presence of intraocular lens: Secondary | ICD-10-CM | POA: Diagnosis not present

## 2021-10-30 ENCOUNTER — Other Ambulatory Visit: Payer: Self-pay | Admitting: Family Medicine

## 2021-10-30 ENCOUNTER — Ambulatory Visit: Payer: Medicare PPO

## 2021-10-30 ENCOUNTER — Ambulatory Visit (INDEPENDENT_AMBULATORY_CARE_PROVIDER_SITE_OTHER): Payer: Medicare PPO

## 2021-10-30 ENCOUNTER — Telehealth: Payer: Self-pay

## 2021-10-30 VITALS — Ht 63.0 in | Wt 167.0 lb

## 2021-10-30 DIAGNOSIS — Z0000 Encounter for general adult medical examination without abnormal findings: Secondary | ICD-10-CM

## 2021-10-30 MED ORDER — TRIAMCINOLONE ACETONIDE 0.1 % MT PSTE: 1.0000 "application " | PASTE | Freq: Two times a day (BID) | OROMUCOSAL | 3 refills | Status: DC

## 2021-10-30 NOTE — Progress Notes (Signed)
? ?Subjective:  ? Nichole Bryant is a 76 y.o. female who presents for Medicare Annual (Subsequent) preventive examination. ?Virtual Visit via Telephone Note ? ?I connected with  Nichole Bryant on 10/30/21 at  2:30 PM EDT by telephone and verified that I am speaking with the correct person using two identifiers. ? ?Location: ?Patient: HOME ?Provider: [facility] ?Persons participating in the virtual visit: patient/Nurse Health Advisor ?  ?I discussed the limitations, risks, security and privacy concerns of performing an evaluation and management service by telephone and the availability of in person appointments. The patient expressed understanding and agreed to proceed. ? ?Interactive audio and video telecommunications were attempted between this nurse and patient, however failed, due to patient having technical difficulties OR patient did not have access to video capability.  We continued and completed visit with audio only. ? ?Some vital signs may be absent or patient reported.  ? ?Chriss Driver, LPN ? ?Review of Systems    ? ?Cardiac Risk Factors include: advanced age (>81mn, >>8women);hypertension;dyslipidemia;sedentary lifestyle ? ?   ?Objective:  ?  ?Today's Vitals  ? 10/30/21 1434  ?Weight: 167 lb (75.8 kg)  ?Height: '5\' 3"'$  (1.6 m)  ? ?Body mass index is 29.58 kg/m?. ? ? ?  10/30/2021  ?  2:43 PM 10/27/2020  ?  3:00 PM  ?Advanced Directives  ?Does Patient Have a Medical Advance Directive? No No  ?Would patient like information on creating a medical advance directive? No - Patient declined No - Patient declined  ? ? ?Current Medications (verified) ?Outpatient Encounter Medications as of 10/30/2021  ?Medication Sig  ? ASPIRIN 81 PO Take 81 mg by mouth daily.   ? atenolol (TENORMIN) 25 MG tablet Take 1 tablet (25 mg total) by mouth daily.  ? Calcium Carb-Cholecalciferol (CALCIUM 1000 + D PO) Take by mouth daily.   ? clotrimazole-betamethasone (LOTRISONE) cream APPLY TO AFFECTED AREA TWICE A DAY  ?  fluticasone (FLONASE) 50 MCG/ACT nasal spray SPRAY 2 SPRAYS INTO EACH NOSTRIL EVERY DAY  ? hydrochlorothiazide (MICROZIDE) 12.5 MG capsule TAKE 1 CAPSULE BY MOUTH EVERY DAY  ? lovastatin (MEVACOR) 20 MG tablet TAKE 1 TABLET BY MOUTH EVERYDAY AT BEDTIME  ? meloxicam (MOBIC) 7.5 MG tablet TAKE 1 TABLET BY MOUTH EVERY DAY  ? Multiple Vitamin (MULTIVITAMIN ADULT PO) Take by mouth daily.  ? vitamin B-12 (CYANOCOBALAMIN) 1000 MCG tablet Take 5,000 mcg by mouth daily.   ? Omega-3 Fatty Acids (FISH OIL) 1000 MG CAPS Take by mouth. (Patient not taking: Reported on 10/30/2021)  ? Zinc Sulfate (ZINC-220 PO) Take by mouth. (Patient not taking: Reported on 10/30/2021)  ? ?No facility-administered encounter medications on file as of 10/30/2021.  ? ? ?Allergies (verified) ?Sulfa antibiotics  ? ?History: ?Past Medical History:  ?Diagnosis Date  ? Arthritis   ? Diabetes mellitus without complication ([place] [place]   ? no meds  ? High cholesterol   ? Hypertension   ? Varicose veins of left lower extremity   ? ?Past Surgical History:  ?Procedure Laterality Date  ? CATARACT EXTRACTION, BILATERAL    ? COLONOSCOPY    ? EYE SURGERY N/A   ? [employer] 10/27/2020  ? TONSILLECTOMY    ? TUBAL LIGATION    ? ?Family History  ?Problem Relation Age of Onset  ? Colon cancer Neg Hx   ? Esophageal cancer Neg Hx   ? Rectal cancer Neg Hx   ? Stomach cancer Neg Hx   ? ?Social History  ? ?Socioeconomic History  ?  Marital status: Married  ?  Spouse name: Not on file  ? Number of children: Not on file  ? Years of education: Not on file  ? Highest education level: Not on file  ?Occupational History  ? Not on file  ?Tobacco Use  ? Smoking status: Never  ? Smokeless tobacco: Never  ?Vaping Use  ? Vaping Use: Never used  ?Substance and Sexual Activity  ? Alcohol use: Yes  ?  Comment: wine occassionally  ? Drug use: No  ? Sexual activity: Not on file  ?Other Topics Concern  ? Not on file  ?Social History Narrative  ? Not on file  ? ?Social Determinants of Health   ? ?Financial Resource Strain: Low Risk   ? Difficulty of Paying Living Expenses: Not hard at all  ?Food Insecurity: No Food Insecurity  ? Worried About Charity fundraiser in the Last Year: Never true  ? Ran Out of Food in the Last Year: Never true  ?Transportation Needs: No Transportation Needs  ? Lack of Transportation (Medical): No  ? Lack of Transportation (Non-Medical): No  ?Physical Activity: Insufficiently Active  ? Days of Exercise per Week: 3 days  ? Minutes of Exercise per Session: 30 min  ?Stress: No Stress Concern Present  ? Feeling of Stress : Not at all  ?Social Connections: Socially Integrated  ? Frequency of Communication with Friends and Family: More than three times a week  ? Frequency of Social Gatherings with Friends and Family: More than three times a week  ? Attends Religious Services: More than 4 times per year  ? Active Member of Clubs or Organizations: Yes  ? Attends Archivist Meetings: More than 4 times per year  ? Marital Status: Married  ? ? ?Tobacco Counseling ?Counseling given: Not Answered ? ? ?Clinical Intake: ? ?Pre-visit preparation completed: Yes ? ?Pain : No/denies pain ? ?  ? ?BMI - recorded: 29.58 ?Nutritional Status: BMI 25 -29 Overweight ?Nutritional Risks: None ?Diabetes: No ? ?How often do you need to have someone help you when you read instructions, pamphlets, or other written materials from your doctor or pharmacy?: 1 - Never ? ?Diabetic?No  ? ?Interpreter Needed?: No ? ?Information entered by :: mj Legna Mausolf, lpn ? ? ?Activities of Daily Living ? ?  10/30/2021  ?  2:46 PM  ?In your present state of health, do you have any difficulty performing the following activities:  ?Hearing? 0  ?Vision? 0  ?Difficulty concentrating or making decisions? 1  ?Walking or climbing stairs? 1  ?Comment At times with stairs.  ?Dressing or bathing? 0  ?Doing errands, shopping? 0  ?Preparing Food and eating ? N  ?Using the Toilet? N  ?In the past six months, have you accidently leaked  urine? Y  ?Comment at times.  ?Do you have problems with loss of bowel control? N  ?Managing your Medications? N  ?Managing your Finances? N  ?Housekeeping or managing your Housekeeping? N  ? ? ?Patient Care Team: ?Susy Frizzle, MD as PCP - General (Family Medicine) ?Kate Sable, MD as PCP - Cardiology (Cardiology) ? ?Indicate any recent Medical Services you may have received from other than [facility] providers in the past year (date may be approximate). ? ?   ?Assessment:  ? This is a routine wellness examination for [place]. ? ?Hearing/Vision screen ?Hearing Screening - Comments:: No hearing issues. ?Vision Screening - Comments:: Glasses. Dr. Prudencio Burly. 09/2021. ? ?Dietary issues and exercise activities discussed: ?Current Exercise Habits: The  patient does not participate in regular exercise at present, Exercise limited by: cardiac condition(s) ? ? Goals Addressed   ? ?  ?  ?  ?  ? This Visit's Progress  ?  Exercise 3x per week (30 min per time)   Not on track  ?  10/30/21-encouraged pt move more.  ?  ?  Prevent falls   On track  ? ?  ? ?Depression Screen ? ?  10/30/2021  ?  2:41 PM 10/27/2020  ?  3:05 PM 10/15/2019  ?  9:33 AM 07/20/2018  ?  9:38 AM 06/07/2017  ?  3:38 PM 03/14/2013  ? 11:15 AM  ?PHQ 2/9 Scores  ?PHQ - 2 Score 0 0 0 0 0 0  ?PHQ- 9 Score  0      ?  ?Fall Risk ? ?  10/30/2021  ?  2:44 PM 10/27/2020  ?  3:02 PM 10/15/2019  ?  9:34 AM 07/20/2018  ?  9:38 AM 06/07/2017  ?  3:38 PM  ?Fall Risk   ?Falls in the past year? 1 0 0 0 No  ?Number falls in past yr: 1 0     ?Injury with Fall? 0 0     ?Risk for fall due to : Impaired balance/gait;History of fall(s) Impaired balance/gait     ?Follow up Falls prevention discussed Falls evaluation completed;Falls prevention discussed Falls evaluation completed Falls evaluation completed   ? ? ?FALL RISK PREVENTION PERTAINING TO THE HOME: ? ?Any stairs in or around the home? Yes  ?If so, are there any without handrails? No  ?Home free of loose throw rugs in walkways, pet beds,  electrical cords, etc? Yes  ?Adequate lighting in your home to reduce risk of falls? Yes  ? ?ASSISTIVE DEVICES UTILIZED TO PREVENT FALLS: ? ?Life alert? No  ?Use of a cane, walker or w/c? No  ?Grab bars in the bat

## 2021-10-30 NOTE — Patient Instructions (Signed)
Ms. Douthitt , ?Thank you for taking time to come for your Medicare Wellness Visit. I appreciate your ongoing commitment to your health goals. Please review the following plan we discussed and let me know if I can assist you in the future.  ? ?Screening recommendations/referrals: ?Colonoscopy: Done 12/10/2019. No repeat required. ?Mammogram: Done 07/26/2019  ?Bone Density: Done 08/31/2018 Repeat every 2 years ? ?Recommended yearly ophthalmology/optometry visit for glaucoma screening and checkup ?Recommended yearly dental visit for hygiene and checkup ? ?Vaccinations: ?Influenza vaccine: Done 03/28/2021 Repeat annually ? ?Pneumococcal vaccine: Done 07/07/2012 and 09/22/2015 ?Tdap vaccine: Done 07/18/2013 Repeat in 10 years ? ?Shingles vaccine: Done 12/30/2017, 09/21/2017 and 02/11/2014 ?Covid-19:Done 09/29/2019, 10/24/2019, 07/29/2020 and 07/22/2021. ? ?Advanced directives: Please bring a copy of your health care power of attorney and living will to the office to be added to your chart at your convenience. ? ? ?Conditions/risks identified: Aim for 30 minutes of exercise or brisk walking, 6-8 glasses of water, and 5 servings of fruits and vegetables each day. ? ? ?Next appointment: Follow up in one year for your annual wellness visit 11/05/2022 @ 2:00 PM. ? ? ?Preventive Care 76 Years and Older, Female ?Preventive care refers to lifestyle choices and visits with your health care provider that can promote health and wellness. ?What does preventive care include? ?A yearly physical exam. This is also called an annual well check. ?Dental exams once or twice a year. ?Routine eye exams. Ask your health care provider how often you should have your eyes checked. ?Personal lifestyle choices, including: ?Daily care of your teeth and gums. ?Regular physical activity. ?Eating a healthy diet. ?Avoiding tobacco and drug use. ?Limiting alcohol use. ?Practicing safe sex. ?Taking low-dose aspirin every day. ?Taking vitamin and mineral supplements as  recommended by your health care provider. ?What happens during an annual well check? ?The services and screenings done by your health care provider during your annual well check will depend on your age, overall health, lifestyle risk factors, and family history of disease. ?Counseling  ?Your health care provider may ask you questions about your: ?Alcohol use. ?Tobacco use. ?Drug use. ?Emotional well-being. ?Home and relationship well-being. ?Sexual activity. ?Eating habits. ?History of falls. ?Memory and ability to understand (cognition). ?Work and work Statistician. ?Reproductive health. ?Screening  ?You may have the following tests or measurements: ?Height, weight, and BMI. ?Blood pressure. ?Lipid and cholesterol levels. These may be checked every 5 years, or more frequently if you are over 28 years old. ?Skin check. ?Lung cancer screening. You may have this screening every year starting at age 41 if you have a 30-pack-year history of smoking and currently smoke or have quit within the past 15 years. ?Fecal occult blood test (FOBT) of the stool. You may have this test every year starting at age 84. ?Flexible sigmoidoscopy or colonoscopy. You may have a sigmoidoscopy every 5 years or a colonoscopy every 10 years starting at age 81. ?Hepatitis C blood test. ?Hepatitis B blood test. ?Sexually transmitted disease (STD) testing. ?Diabetes screening. This is done by checking your blood sugar (glucose) after you have not eaten for a while (fasting). You may have this done every 1-3 years. ?Bone density scan. This is done to screen for osteoporosis. You may have this done starting at age 72. ?Mammogram. This may be done every 1-2 years. Talk to your health care provider about how often you should have regular mammograms. ?Talk with your health care provider about your test results, treatment options, and if necessary, the need  for more tests. ?Vaccines  ?Your health care provider may recommend certain vaccines, such  as: ?Influenza vaccine. This is recommended every year. ?Tetanus, diphtheria, and acellular pertussis (Tdap, Td) vaccine. You may need a Td booster every 10 years. ?Zoster vaccine. You may need this after age 29. ?Pneumococcal 13-valent conjugate (PCV13) vaccine. One dose is recommended after age 51. ?Pneumococcal polysaccharide (PPSV23) vaccine. One dose is recommended after age 88. ?Talk to your health care provider about which screenings and vaccines you need and how often you need them. ?This information is not intended to replace advice given to you by your health care provider. Make sure you discuss any questions you have with your health care provider. ?Document Released: 07/18/2015 Document Revised: 03/10/2016 Document Reviewed: 04/22/2015 ?Elsevier Interactive Patient Education ? 2017 [place]. ? ?Fall Prevention in the Home ?Falls can cause injuries. They can happen to people of all ages. There are many things you can do to make your home safe and to help prevent falls. ?What can I do on the outside of my home? ?Regularly fix the edges of walkways and driveways and fix any cracks. ?Remove anything that might make you trip as you walk through a door, such as a raised step or threshold. ?Trim any bushes or trees on the path to your home. ?Use bright outdoor lighting. ?Clear any walking paths of anything that might make someone trip, such as rocks or tools. ?Regularly check to see if handrails are loose or broken. Make sure that both sides of any steps have handrails. ?Any raised decks and porches should have guardrails on the edges. ?Have any leaves, snow, or ice cleared regularly. ?Use sand or salt on walking paths during winter. ?Clean up any spills in your garage right away. This includes oil or grease spills. ?What can I do in the bathroom? ?Use night lights. ?Install grab bars by the toilet and in the tub and shower. Do not use towel bars as grab bars. ?Use non-skid mats or decals in the tub or  shower. ?If you need to sit down in the shower, use a plastic, non-slip stool. ?Keep the floor dry. Clean up any water that spills on the floor as soon as it happens. ?Remove soap buildup in the tub or shower regularly. ?Attach bath mats securely with double-sided non-slip rug tape. ?Do not have throw rugs and other things on the floor that can make you trip. ?What can I do in the bedroom? ?Use night lights. ?Make sure that you have a light by your bed that is easy to reach. ?Do not use any sheets or blankets that are too big for your bed. They should not hang down onto the floor. ?Have a firm chair that has side arms. You can use this for support while you get dressed. ?Do not have throw rugs and other things on the floor that can make you trip. ?What can I do in the kitchen? ?Clean up any spills right away. ?Avoid walking on wet floors. ?Keep items that you use a lot in easy-to-reach places. ?If you need to reach something above you, use a strong step stool that has a grab bar. ?Keep electrical cords out of the way. ?Do not use floor polish or wax that makes floors slippery. If you must use wax, use non-skid floor wax. ?Do not have throw rugs and other things on the floor that can make you trip. ?What can I do with my stairs? ?Do not leave any items on the stairs. ?  Make sure that there are handrails on both sides of the stairs and use them. Fix handrails that are broken or loose. Make sure that handrails are as long as the stairways. ?Check any carpeting to make sure that it is firmly attached to the stairs. Fix any carpet that is loose or worn. ?Avoid having throw rugs at the top or bottom of the stairs. If you do have throw rugs, attach them to the floor with carpet tape. ?Make sure that you have a light switch at the top of the stairs and the bottom of the stairs. If you do not have them, ask someone to add them for you. ?What else can I do to help prevent falls? ?Wear shoes that: ?Do not have high heels. ?Have  rubber bottoms. ?Are comfortable and fit you well. ?Are closed at the toe. Do not wear sandals. ?If you use a stepladder: ?Make sure that it is fully opened. Do not climb a closed stepladder. ?Make sure that bot

## 2021-10-30 NOTE — Telephone Encounter (Signed)
Pt c/o "white ulcers" to bottom lip and gum x 4 weeks. Pt asks if there is anything she can use or be called in to treat. Pt states she has had these before but they seem to happen more frequently.  ?Pt advised she may need to be seen and verbalized understanding.  ?

## 2021-11-09 ENCOUNTER — Other Ambulatory Visit: Payer: Self-pay | Admitting: Family Medicine

## 2021-11-09 DIAGNOSIS — R059 Cough, unspecified: Secondary | ICD-10-CM

## 2021-11-10 NOTE — Telephone Encounter (Signed)
Requested medication (s) are due for refill today:   No ? ?Requested medication (s) are on the active medication list:   No ? ?Future visit scheduled:   No ? ? ?Last ordered: Discontinued 08/03/2021 ? ?Returned because this medication is not on the med list.  Been discontinued.    ? ?Requested Prescriptions  ?Pending Prescriptions Disp Refills  ? benzonatate (TESSALON) 100 MG capsule [Pharmacy Med Name: BENZONATATE 100 MG CAPSULE] 20 capsule 0  ?  Sig: PLEASE SEE ATTACHED FOR DETAILED DIRECTIONS  ?  ? Ear, Nose, and Throat:  Antitussives/Expectorants Passed - 11/09/2021  3:33 PM  ?  ?  Passed - Valid encounter within last 12 months  ?  Recent Outpatient Visits   ? ?      ? 8 months ago Sore throat  ? [place] Eulogio Bear, NP  ? 11 months ago Essential hypertension  ? [facility] [place], Cammie Mcgee, MD  ? 1 year ago Dyspnea on exertion  ? [facility] Pickard, Cammie Mcgee, MD  ? 2 years ago Routine general medical examination at a health care facility  ? [facility] Pickard, Cammie Mcgee, MD  ? 3 years ago Routine general medical examination at a health care facility  ? [facility] Pickard, Cammie Mcgee, MD  ? ?  ?  ? ? ?  ?  ?  ? ?

## 2021-11-17 ENCOUNTER — Ambulatory Visit: Payer: Medicare PPO | Admitting: Family Medicine

## 2021-11-17 ENCOUNTER — Other Ambulatory Visit: Payer: Self-pay | Admitting: Family Medicine

## 2021-11-17 ENCOUNTER — Ambulatory Visit
Admission: RE | Admit: 2021-11-17 | Discharge: 2021-11-17 | Disposition: A | Discharge status: Home / Self Care | Payer: Medicare PPO | Source: Ambulatory Visit | Attending: Family Medicine | Admitting: Family Medicine

## 2021-11-17 VITALS — BP 130/92 | HR 70 | Temp 98.0°F | Ht 63.0 in | Wt 171.2 lb

## 2021-11-17 DIAGNOSIS — R0781 Pleurodynia: Secondary | ICD-10-CM

## 2021-11-17 DIAGNOSIS — M25551 Pain in right hip: Secondary | ICD-10-CM

## 2021-11-17 DIAGNOSIS — J0100 Acute maxillary sinusitis, unspecified: Secondary | ICD-10-CM

## 2021-11-17 DIAGNOSIS — M1611 Unilateral primary osteoarthritis, right hip: Secondary | ICD-10-CM | POA: Diagnosis not present

## 2021-11-17 MED ORDER — TRAMADOL HCL 50 MG PO TABS: 100.0000 mg | ORAL_TABLET | Freq: Four times a day (QID) | ORAL | 0 refills | Status: AC | PRN

## 2021-11-17 MED ORDER — AMOXICILLIN 875 MG PO TABS: 875.0000 mg | ORAL_TABLET | Freq: Two times a day (BID) | ORAL | 0 refills | Status: AC

## 2021-11-17 NOTE — Progress Notes (Signed)
? ?Subjective:  ? ? Patient ID: Nichole Bryant, female    DOB: 1945-09-22, 76 y.o.   MRN: [id number] ? ?HPI  ?Patient reports a 10-day history of rhinorrhea, head congestion, postnasal drip, and purulent nasal discharge.  She is blowing green mucus out of her nose when she blows her nose.  She reports pain and pressure in the left maxillary sinus and dull headache.  She denies any fevers or chills.  She denies any sore throat or chest pain or cough.  1 week ago, she tripped and fell while on vacation and landed striking her right lower back and her right posterior hip on a wooden deck chair.  She has significant pain over the iliac crest on the right side and also has sharp stabbing pain when I palpate the right lung.  We have.  She denies any hemoptysis or shortness of breath or pleurisy ?Past Medical History:  ?Diagnosis Date  ? Arthritis   ? Diabetes mellitus without complication ([place])   ? no meds  ? High cholesterol   ? Hypertension   ? Varicose veins of left lower extremity   ? ?Past Surgical History:  ?Procedure Laterality Date  ? CATARACT EXTRACTION, BILATERAL    ? COLONOSCOPY    ? EYE SURGERY N/A   ? [employer] 10/27/2020  ? TONSILLECTOMY    ? TUBAL LIGATION    ? ?Current Outpatient Medications on File Prior to Visit  ?Medication Sig Dispense Refill  ? atenolol (TENORMIN) 25 MG tablet Take 1 tablet (25 mg total) by mouth daily. 90 tablet 1  ? Calcium Carb-Cholecalciferol (CALCIUM 1000 + D PO) Take by mouth daily.     ? clotrimazole-betamethasone (LOTRISONE) cream APPLY TO AFFECTED AREA TWICE A DAY 30 g 0  ? fluticasone (FLONASE) 50 MCG/ACT nasal spray SPRAY 2 SPRAYS INTO EACH NOSTRIL EVERY DAY 48 mL 2  ? hydrochlorothiazide (MICROZIDE) 12.5 MG capsule TAKE 1 CAPSULE BY MOUTH EVERY DAY 90 capsule 1  ? lovastatin (MEVACOR) 20 MG tablet TAKE 1 TABLET BY MOUTH EVERYDAY AT BEDTIME 90 tablet 3  ? meloxicam (MOBIC) 7.5 MG tablet TAKE 1 TABLET BY MOUTH EVERY DAY 90 tablet 3  ? Multiple Vitamin (MULTIVITAMIN ADULT  PO) Take by mouth daily.    ? Omega-3 Fatty Acids (FISH OIL) 1000 MG CAPS Take by mouth.    ? triamcinolone (KENALOG) 0.1 % paste Use as directed 1 application. in the mouth or throat 2 (two) times daily. 5 g 3  ? vitamin B-12 (CYANOCOBALAMIN) 1000 MCG tablet Take 5,000 mcg by mouth daily.     ? ?No current facility-administered medications on file prior to visit.  ? ?Allergies  ?Allergen Reactions  ? Sulfa Antibiotics   ?  Hives, made ears swell  ? ?Social History  ? ?Socioeconomic History  ? Marital status: Married  ?  Spouse name: Not on file  ? Number of children: Not on file  ? Years of education: Not on file  ? Highest education level: Not on file  ?Occupational History  ? Not on file  ?Tobacco Use  ? Smoking status: Never  ? Smokeless tobacco: Never  ?Vaping Use  ? Vaping Use: Never used  ?Substance and Sexual Activity  ? Alcohol use: Yes  ?  Comment: wine occassionally  ? Drug use: No  ? Sexual activity: Not on file  ?Other Topics Concern  ? Not on file  ?Social History Narrative  ? Not on file  ? ?Social Determinants of Health  ? ?  Financial Resource Strain: Low Risk   ? Difficulty of Paying Living Expenses: Not hard at all  ?Food Insecurity: No Food Insecurity  ? Worried About Charity fundraiser in the Last Year: Never true  ? Ran Out of Food in the Last Year: Never true  ?Transportation Needs: No Transportation Needs  ? Lack of Transportation (Medical): No  ? Lack of Transportation (Non-Medical): No  ?Physical Activity: Insufficiently Active  ? Days of Exercise per Week: 3 days  ? Minutes of Exercise per Session: 30 min  ?Stress: No Stress Concern Present  ? Feeling of Stress : Not at all  ?Social Connections: Socially Integrated  ? Frequency of Communication with Friends and Family: More than three times a week  ? Frequency of Social Gatherings with Friends and Family: More than three times a week  ? Attends Religious Services: More than 4 times per year  ? Active Member of Clubs or Organizations: Yes  ?  Attends Archivist Meetings: More than 4 times per year  ? Marital Status: Married  ?Intimate Partner Violence: Not At Risk  ? Fear of Current or Ex-Partner: No  ? Emotionally Abused: No  ? Physically Abused: No  ? Sexually Abused: No  ? ?Family History  ?Problem Relation Age of Onset  ? Colon cancer Neg Hx   ? Esophageal cancer Neg Hx   ? Rectal cancer Neg Hx   ? Stomach cancer Neg Hx   ? ? ? ? ?Review of Systems ? ?   ?Objective:  ? Physical Exam ?HENT:  ?   Right Ear: Tympanic membrane and ear canal normal.  ?   Left Ear: Tympanic membrane and ear canal normal.  ?   Nose: Congestion and rhinorrhea present.  ?   Left Sinus: Maxillary sinus tenderness present.  ?Cardiovascular:  ?   Rate and Rhythm: Regular rhythm.  ?   Heart sounds: Normal heart sounds. No murmur heard. ?  No friction rub. No gallop.  ?Pulmonary:  ?   Effort: Pulmonary effort is normal. No respiratory distress.  ?   Breath sounds: Normal breath sounds. No stridor. No wheezing, rhonchi or rales.  ? ? ?Abdominal:  ?   General: Abdomen is flat. Bowel sounds are normal. There is no distension.  ?   Palpations: Abdomen is soft.  ?   Tenderness: There is no abdominal tenderness. There is no guarding.  ?Musculoskeletal:  ?   Cervical back: Neck supple.  ?   Right hip: Tenderness present. No deformity, lacerations, bony tenderness or crepitus. Decreased range of motion.  ?     Legs: ? ?Skin: ?   Findings: No erythema or rash.  ? ? ? Rib pain - Plan: DG Ribs Unilateral Right ? ?Right hip pain - Plan: DG Hip Unilat W OR W/O Pelvis 2-3 Views Right ? ?Acute non-recurrent maxillary sinusitis ?I believe the patient has a sinus infection.  Add amoxicillin 875 mg twice daily for 10 days for sinusitis.  I believe she bruised her lower right ribs.  I will get a chest x-ray to evaluate further and evaluate the right posterior ribs.  She can use ibuprofen 800 mg every 8 hours, Tylenol 500 mg every 6 hours and tramadol in addition as needed for pain.  I will  also get an x-ray of the right hip and pelvis.  Most of her pain is over the iliac crest.  She has pain with ambulation however.  I want to rule out subtle fracture  so I can give the patient a prognostic indication of how long she should feel pain. ?

## 2021-11-18 ENCOUNTER — Encounter: Payer: Self-pay | Admitting: Family Medicine

## 2021-12-07 ENCOUNTER — Ambulatory Visit: Payer: Medicare PPO | Admitting: Cardiology

## 2021-12-07 ENCOUNTER — Encounter: Payer: Self-pay | Admitting: Cardiology

## 2021-12-07 VITALS — BP 142/70 | HR 71 | Ht 63.0 in | Wt 168.4 lb

## 2021-12-07 DIAGNOSIS — I83893 Varicose veins of bilateral lower extremities with other complications: Secondary | ICD-10-CM | POA: Diagnosis not present

## 2021-12-07 DIAGNOSIS — I10 Essential (primary) hypertension: Secondary | ICD-10-CM

## 2021-12-07 DIAGNOSIS — E7800 Pure hypercholesterolemia, unspecified: Secondary | ICD-10-CM

## 2021-12-07 NOTE — Progress Notes (Signed)
Cardiology Office Note:    Date:  12/07/2021   ID:  Nichole Bryant, [name] 08-Apr-1946, MRN [id number]  PCP:  Susy Frizzle, MD  [facility] Cardiologist:  Kate Sable, MD  [place] Electrophysiologist:  None   Referring MD: Susy Frizzle, MD   Chief Complaint  Patient presents with   Follow-up    12 month follow up. Patient says that she has swelling in ankles every. Shortness of breath with strenuous activities. Meds reviewed with patient.     History of Present Illness:    Nichole Bryant is a 76 y.o. female with a hx of hypertension, hyperlipidemia, varicose veins who presents due to ankle swelling and follow-up.  Previously seen due to dyspnea on exertion.  Work-up with echo showed normal systolic function, diastolic function not calculated.  Coronary CT.  No obstructive disease, minimal calcium in the LAD.  Complains of leg edema, she has varicose veins, so events patient is over 10 years ago in [place].  Otherwise denies chest pain or shortness of breath.  Blood pressures at home are usually controlled with systolic in the 601.  Was rushing to clinic today after being lost trying to find location.   Prior notes Coronary CTA 04/2020 minimal mid LAD stenosis less than 25% Echocardiogram 03/2020 EF 55 to 60%  Past Medical History:  Diagnosis Date   Arthritis    Diabetes mellitus without complication (HCC)    no meds   High cholesterol    Hypertension    Varicose veins of left lower extremity     Past Surgical History:  Procedure Laterality Date   CATARACT EXTRACTION, BILATERAL     COLONOSCOPY     EYE SURGERY N/A    [employer] 10/27/2020   TONSILLECTOMY     TUBAL LIGATION      Current Medications: Current Meds  Medication Sig   atenolol (TENORMIN) 25 MG tablet Take 1 tablet (25 mg total) by mouth daily.   clotrimazole-betamethasone (LOTRISONE) cream APPLY TO AFFECTED AREA TWICE A DAY   fluticasone (FLONASE) 50 MCG/ACT nasal spray  SPRAY 2 SPRAYS INTO EACH NOSTRIL EVERY DAY   hydrochlorothiazide (MICROZIDE) 12.5 MG capsule TAKE 1 CAPSULE BY MOUTH EVERY DAY   lovastatin (MEVACOR) 20 MG tablet TAKE 1 TABLET BY MOUTH EVERYDAY AT BEDTIME   meloxicam (MOBIC) 7.5 MG tablet TAKE 1 TABLET BY MOUTH EVERY DAY   Multiple Vitamin (MULTIVITAMIN ADULT PO) Take by mouth daily.   Omega-3 Fatty Acids (FISH OIL) 1000 MG CAPS Take by mouth.   vitamin B-12 (CYANOCOBALAMIN) 1000 MCG tablet Take 5,000 mcg by mouth daily.      Allergies:   Sulfa antibiotics   Social History   Socioeconomic History   Marital status: Married    Spouse name: Not on file   Number of children: Not on file   Years of education: Not on file   Highest education level: Not on file  Occupational History   Not on file  Tobacco Use   Smoking status: Never   Smokeless tobacco: Never  Vaping Use   Vaping Use: Never used  Substance and Sexual Activity   Alcohol use: Yes    Comment: wine occassionally   Drug use: No   Sexual activity: Not on file  Other Topics Concern   Not on file  Social History Narrative   Not on file   Social Determinants of Health   Financial Resource Strain: Low Risk    Difficulty of Paying Living Expenses: Not  hard at all  Food Insecurity: No Food Insecurity   Worried About Charity fundraiser in the Last Year: Never true   Ran Out of Food in the Last Year: Never true  Transportation Needs: No Transportation Needs   Lack of Transportation (Medical): No   Lack of Transportation (Non-Medical): No  Physical Activity: Insufficiently Active   Days of Exercise per Week: 3 days   Minutes of Exercise per Session: 30 min  Stress: No Stress Concern Present   Feeling of Stress : Not at all  Social Connections: Socially Integrated   Frequency of Communication with Friends and Family: More than three times a week   Frequency of Social Gatherings with Friends and Family: More than three times a week   Attends Religious Services: More  than 4 times per year   Active Member of [employer] or Organizations: Yes   Attends Music therapist: More than 4 times per year   Marital Status: Married     Family History: The patient's family history is negative for Colon cancer, Esophageal cancer, Rectal cancer, and Stomach cancer.  ROS:   Please see the history of present illness.     All other systems reviewed and are negative.  EKGs/Labs/Other Studies Reviewed:    The following studies were reviewed today:   EKG:  EKG is  ordered today.  The ekg ordered today demonstrates sinus rhythm, occasional PVC, first-degree AV block.  Recent Labs: 12/10/2020: ALT 19; BUN 21; Creat 0.80; Hemoglobin 13.7; Platelets 198; Potassium 4.2; Sodium 139  Recent Lipid Panel    Component Value Date/Time   CHOL 182 12/10/2020 0824   TRIG 154 (H) 12/10/2020 0824   HDL 52 12/10/2020 0824   CHOLHDL 3.5 12/10/2020 0824   VLDL 17 02/05/2015 0937   LDLCALC 103 (H) 12/10/2020 0824    Physical Exam:    VS:  BP (!) 142/70 (BP Location: Left Arm, Patient Position: Sitting, Cuff Size: Normal)   Pulse 71   Ht '5\' 3"'$  (1.6 m)   Wt 168 lb 6.4 oz (76.4 kg)   SpO2 96%   BMI 29.83 kg/m     Wt Readings from Last 3 Encounters:  12/07/21 168 lb 6.4 oz (76.4 kg)  11/17/21 171 lb 3.2 oz (77.7 kg)  10/30/21 167 lb (75.8 kg)     GEN:  Well nourished, well developed in no acute distress HEENT: Normal NECK: No JVD; No carotid bruits CARDIAC: RRR, no murmurs, rubs, gallops RESPIRATORY:  Clear to auscultation without rales, wheezing or rhonchi  ABDOMEN: Soft, non-tender, non-distended MUSCULOSKELETAL: trace edema, varicose veins noted SKIN: Warm and dry NEUROLOGIC:  Alert and oriented x 3 PSYCHIATRIC:  Normal affect   ASSESSMENT:    1. Varicose veins of both legs with edema   2. Primary hypertension   3. Pure hypercholesterolemia     PLAN:    In order of problems listed above:  Leg edema, shortness of breath, prior echo in 2021 EF 55  to 60%.  Edema likely from venous insufficiency.  Compression stockings, leg raising while in seated position advised.  Refer to vein and vascular clinic. hypertension, BP elevated today, usually controlled at home.   continue HCTZ, atenolol. History of hyperlipidemia, continue statin.  Follow-up in 12 to 18 months  Total encounter time 35 minutes  Greater than 50% was spent in counseling and coordination of care with the patient   This note was generated in part or whole with voice recognition software. Voice recognition is  usually quite accurate but there are transcription errors that can and very often do occur. I apologize for any typographical errors that were not detected and corrected.  Medication Adjustments/Labs and Tests Ordered: Current medicines are reviewed at length with the patient today.  Concerns regarding medicines are outlined above.  Orders Placed This Encounter  Procedures   Ambulatory referral to Vascular Surgery   EKG 12-Lead   No orders of the defined types were placed in this encounter.   Patient Instructions  Medication Instructions:  Your physician recommends that you continue on your current medications as directed. Please refer to the Current Medication list given to you today.  *If you need a refill on your cardiac medications before your next appointment, please call your pharmacy*   Lab Work: None ordered If you have labs (blood work) drawn today and your tests are completely normal, you will receive your results only by: [place] (if you have MyChart) OR A paper copy in the mail If you have any lab test that is abnormal or we need to change your treatment, we will call you to review the results.   Testing/Procedures: None ordered   Follow-Up: At [facility], you and your health needs are our priority.  As part of our continuing mission to provide you with exceptional heart care, we have created designated Provider Care Teams.  These  Care Teams include your primary Cardiologist (physician) and Advanced Practice Providers (APPs -  Physician Assistants and Nurse Practitioners) who all work together to provide you with the care you need, when you need it.  We recommend signing up for the patient portal called "MyChart".  Sign up information is provided on this After Visit Summary.  MyChart is used to connect with patients for Virtual Visits (Telemedicine).  Patients are able to view lab/test results, encounter notes, upcoming appointments, etc.  Non-urgent messages can be sent to your provider as well.   To learn more about what you can do with MyChart, go to [email or web address].    Your next appointment:   12-18 month(s)  The format for your next appointment:   In Person  Provider:   You may see Kate Sable, MD or one of the following Advanced Practice Providers on your designated Care Team:   Murray Hodgkins, NP Christell Faith, PA-C Cadence Kathlen Mody, [place]   Other Instructions   Important Information About Sugar         Signed, Kate Sable, MD  12/07/2021 3:46 PM    [place]

## 2021-12-07 NOTE — Patient Instructions (Signed)
Medication Instructions:  Your physician recommends that you continue on your current medications as directed. Please refer to the Current Medication list given to you today.  *If you need a refill on your cardiac medications before your next appointment, please call your pharmacy*   Lab Work: None ordered If you have labs (blood work) drawn today and your tests are completely normal, you will receive your results only by: [place] (if you have MyChart) OR A paper copy in the mail If you have any lab test that is abnormal or we need to change your treatment, we will call you to review the results.   Testing/Procedures: None ordered   Follow-Up: At [facility], you and your health needs are our priority.  As part of our continuing mission to provide you with exceptional heart care, we have created designated Provider Care Teams.  These Care Teams include your primary Cardiologist (physician) and Advanced Practice Providers (APPs -  Physician Assistants and Nurse Practitioners) who all work together to provide you with the care you need, when you need it.  We recommend signing up for the patient portal called "MyChart".  Sign up information is provided on this After Visit Summary.  MyChart is used to connect with patients for Virtual Visits (Telemedicine).  Patients are able to view lab/test results, encounter notes, upcoming appointments, etc.  Non-urgent messages can be sent to your provider as well.   To learn more about what you can do with MyChart, go to [email or web address].    Your next appointment:   12-18 month(s)  The format for your next appointment:   In Person  Provider:   You may see Kate Sable, MD or one of the following Advanced Practice Providers on your designated Care Team:   Murray Hodgkins, NP Christell Faith, PA-C Cadence Kathlen Mody, [place]   Other Instructions   Important Information About Sugar

## 2022-01-04 ENCOUNTER — Encounter (INDEPENDENT_AMBULATORY_CARE_PROVIDER_SITE_OTHER): Payer: Medicare PPO | Admitting: Nurse Practitioner

## 2022-01-12 ENCOUNTER — Encounter (INDEPENDENT_AMBULATORY_CARE_PROVIDER_SITE_OTHER): Payer: Self-pay | Admitting: Nurse Practitioner

## 2022-01-12 ENCOUNTER — Ambulatory Visit (INDEPENDENT_AMBULATORY_CARE_PROVIDER_SITE_OTHER): Payer: Medicare PPO | Admitting: Nurse Practitioner

## 2022-01-12 VITALS — BP 137/76 | HR 71 | Resp 16 | Ht 63.0 in | Wt 164.6 lb

## 2022-01-12 DIAGNOSIS — I10 Essential (primary) hypertension: Secondary | ICD-10-CM

## 2022-01-12 DIAGNOSIS — E7800 Pure hypercholesterolemia, unspecified: Secondary | ICD-10-CM | POA: Diagnosis not present

## 2022-01-12 DIAGNOSIS — I8311 Varicose veins of right lower extremity with inflammation: Secondary | ICD-10-CM

## 2022-01-12 DIAGNOSIS — I8312 Varicose veins of left lower extremity with inflammation: Secondary | ICD-10-CM

## 2022-01-24 ENCOUNTER — Encounter (INDEPENDENT_AMBULATORY_CARE_PROVIDER_SITE_OTHER): Payer: Self-pay | Admitting: Nurse Practitioner

## 2022-01-24 NOTE — Progress Notes (Signed)
Subjective:    Patient ID: Nichole Bryant, female    DOB: 01-Sep-1945, 76 y.o.   MRN: [id number] Chief Complaint  Patient presents with   New Patient (Initial Visit)    Ref Agbor-etang consult bilateral varicose veins    The patient is seen for evaluation of symptomatic varicose veins. The patient relates burning and stinging which worsened steadily throughout the course of the day, particularly with standing. The patient also notes an aching and throbbing pain over the varicosities, particularly with prolonged dependent positions. The symptoms are significantly improved with elevation.  The patient also notes that during hot weather the symptoms are greatly intensified. The patient states the pain from the varicose veins interferes with work, daily exercise, shopping and household maintenance. At this point, the symptoms are persistent and severe enough that they're having a negative impact on lifestyle and are interfering with daily activities.  There is no history of DVT, PE or superficial thrombophlebitis. There is no history of ulceration or hemorrhage. The patient denies a significant family history of varicose veins.  The patient has worn graduated compression in the past. At the present time the patient has not been using over-the-counter analgesics.  The patient does note that she had prior intervention several years ago but it is unclear the exact intervention.       Review of Systems  Cardiovascular:  Positive for leg swelling.  All other systems reviewed and are negative.      Objective:   Physical Exam Vitals reviewed.  HENT:     Head: Normocephalic.  Cardiovascular:     Rate and Rhythm: Normal rate.     Pulses: Normal pulses.  Pulmonary:     Effort: Pulmonary effort is normal.  Musculoskeletal:     Right lower leg: Edema present.     Left lower leg: Edema present.  Skin:    General: Skin is warm and dry.  Neurological:     Mental Status: She is alert  and oriented to person, place, and time.  Psychiatric:        Mood and Affect: Mood normal.        Behavior: Behavior normal.        Thought Content: Thought content normal.        Judgment: Judgment normal.     BP 137/76 (BP Location: Right Arm)   Pulse 71   Resp 16   Ht '5\' 3"'$  (1.6 m)   Wt 164 lb 9.6 oz (74.7 kg)   BMI 29.16 kg/m   Past Medical History:  Diagnosis Date   Arthritis    Diabetes mellitus without complication (HCC)    no meds   High cholesterol    Hypertension    Varicose veins of left lower extremity     Social History   Socioeconomic History   Marital status: Married    Spouse name: Not on file   Number of children: Not on file   Years of education: Not on file   Highest education level: Not on file  Occupational History   Not on file  Tobacco Use   Smoking status: Never   Smokeless tobacco: Never  Vaping Use   Vaping Use: Never used  Substance and Sexual Activity   Alcohol use: Yes    Comment: wine occassionally   Drug use: No   Sexual activity: Not on file  Other Topics Concern   Not on file  Social History Narrative   Not on file   Social  Determinants of Health   Financial Resource Strain: Low Risk  (10/30/2021)   Overall Financial Resource Strain (CARDIA)    Difficulty of Paying Living Expenses: Not hard at all  Food Insecurity: No Food Insecurity (10/30/2021)   Hunger Vital Sign    Worried About Running Out of Food in the Last Year: Never true    Ran Out of Food in the Last Year: Never true  Transportation Needs: No Transportation Needs (10/30/2021)   PRAPARE - Hydrologist (Medical): No    Lack of Transportation (Non-Medical): No  Physical Activity: Insufficiently Active (10/30/2021)   Exercise Vital Sign    Days of Exercise per Week: 3 days    Minutes of Exercise per Session: 30 min  Stress: No Stress Concern Present (10/30/2021)   [place]    Feeling of Stress : Not at all  Social Connections: Socially Integrated (10/30/2021)   Social Connection and Isolation Panel [NHANES]    Frequency of Communication with Friends and Family: More than three times a week    Frequency of Social Gatherings with Friends and Family: More than three times a week    Attends Religious Services: More than 4 times per year    Active Member of Clubs or Organizations: Yes    Attends Archivist Meetings: More than 4 times per year    Marital Status: Married  Human resources officer Violence: Not At Risk (10/30/2021)   Humiliation, Afraid, Rape, and Kick questionnaire    Fear of Current or Ex-Partner: No    Emotionally Abused: No    Physically Abused: No    Sexually Abused: No    Past Surgical History:  Procedure Laterality Date   CATARACT EXTRACTION, BILATERAL     COLONOSCOPY     EYE SURGERY N/A    [employer]rees[employer] 10/27/2020   TONSILLECTOMY     TUBAL LIGATION      Family History  Problem Relation Age of Onset   Colon cancer Neg Hx    Esophageal cancer Neg Hx    Rectal cancer Neg Hx    Stomach cancer Neg Hx     Allergies  Allergen Reactions   Sulfa Antibiotics     Hives, made ears swell       Latest Ref Rng & Units 12/10/2020    8:24 AM 10/10/2019    9:44 AM 07/21/2018    9:35 AM  CBC  WBC 3.8 - 10.8 Thousand/uL 6.3  6.3  7.6   Hemoglobin 11.7 - 15.5 g/dL 13.7  12.8  13.7   Hematocrit 35.0 - 45.0 % 42.4  39.1  41.6   Platelets 140 - 400 Thousand/uL 198  177  193       CMP     Component Value Date/Time   NA 139 12/10/2020 0824   K 4.2 12/10/2020 0824   CL 101 12/10/2020 0824   CO2 23 12/10/2020 0824   GLUCOSE 121 (H) 12/10/2020 0824   BUN 21 12/10/2020 0824   CREATININE 0.80 12/10/2020 0824   CALCIUM 9.6 12/10/2020 0824   PROT 6.4 12/10/2020 0824   ALBUMIN 4.1 09/22/2015 1004   AST 21 12/10/2020 0824   ALT 19 12/10/2020 0824   ALKPHOS 56 09/22/2015 1004   BILITOT 0.5 12/10/2020 0824   GFRNONAA 72  12/10/2020 0824   GFRAA 84 12/10/2020 0824     No results found.     Assessment & Plan:   1. Varicose  veins of both lower extremities with inflammation  Recommend:  The patient has large symptomatic varicose veins that are passociated with swelling.  I have had a long discussion with the patient regarding  varicose veins and why they cause symptoms.  Patient will begin wearing graduated compression stockings class 1 on a daily basis, beginning first thing in the morning and removing them in the evening. The patient is instructed specifically not to sleep in the stockings.    The patient  will also begin using over-the-counter analgesics such as Motrin 600 mg po TID to help control the symptoms.    In addition, behavioral modification including elevation during the day will be initiated.    Pending the results of these changes the  patient will be reevaluated in three months.   An ultrasound of the venous system will be obtained.   Further plans will be based on the ultrasound results and whether conservative therapies are successful at eliminating the pain and swelling.    2. Primary hypertension Continue antihypertensive medications as already ordered, these medications have been reviewed and there are no changes at this time.   3. High cholesterol Continue statin as ordered and reviewed, no changes at this time    Current Outpatient Medications on File Prior to Visit  Medication Sig Dispense Refill   atenolol (TENORMIN) 25 MG tablet Take 1 tablet (25 mg total) by mouth daily. 90 tablet 1   clotrimazole-betamethasone (LOTRISONE) cream APPLY TO AFFECTED AREA TWICE A DAY 30 g 0   fluticasone (FLONASE) 50 MCG/ACT nasal spray SPRAY 2 SPRAYS INTO EACH NOSTRIL EVERY DAY 48 mL 2   hydrochlorothiazide (MICROZIDE) 12.5 MG capsule TAKE 1 CAPSULE BY MOUTH EVERY DAY 90 capsule 1   lovastatin (MEVACOR) 20 MG tablet TAKE 1 TABLET BY MOUTH EVERYDAY AT BEDTIME 90 tablet 3   meloxicam (MOBIC)  7.5 MG tablet TAKE 1 TABLET BY MOUTH EVERY DAY 90 tablet 3   Multiple Vitamin (MULTIVITAMIN ADULT PO) Take by mouth daily.     Omega-3 Fatty Acids (FISH OIL) 1000 MG CAPS Take by mouth.     vitamin B-12 (CYANOCOBALAMIN) 1000 MCG tablet Take 5,000 mcg by mouth daily.      Calcium Carb-Cholecalciferol (CALCIUM 1000 + D PO) Take by mouth daily.  (Patient not taking: Reported on 12/07/2021)     triamcinolone (KENALOG) 0.1 % paste Use as directed 1 application. in the mouth or throat 2 (two) times daily. (Patient not taking: Reported on 12/07/2021) 5 g 3   No current facility-administered medications on file prior to visit.    There are no Patient Instructions on file for this visit. No follow-ups on file.   Kris Hartmann, NP

## 2022-02-11 ENCOUNTER — Other Ambulatory Visit: Payer: Self-pay | Admitting: Family Medicine

## 2022-02-11 NOTE — Telephone Encounter (Signed)
Requested medication (s) are due for refill today: yes  Requested medication (s) are on the active medication list: yes  Last refill:  08/11/21 #90 with 1 RF  Future visit scheduled: no, seen 11/17/21  Notes to clinic:  Failed protocol of labs within 12 months, creatinine, no upcoming appt, just seen 11/17/21, please assess.       Requested Prescriptions  Pending Prescriptions Disp Refills   atenolol (TENORMIN) 25 MG tablet [Pharmacy Med Name: ATENOLOL 25 MG TABLET] 90 tablet 1    Sig: Take 1 tablet (25 mg total) by mouth daily.     Cardiovascular: Beta Blockers 2 Failed - 02/11/2022  2:24 AM      Failed - Cr in normal range and within 360 days    Creat  Date Value Ref Range Status  12/10/2020 0.80 0.60 - 0.93 mg/dL Final    Comment:    For patients >88 years of age, the reference limit for Creatinine is approximately 13% higher for people identified as African-American. .    Creatinine, Urine  Date Value Ref Range Status  12/10/2020 66 20 - 275 mg/dL Final         Passed - Last BP in normal range    BP Readings from Last 1 Encounters:  01/12/22 137/76         Passed - Last Heart Rate in normal range    Pulse Readings from Last 1 Encounters:  01/12/22 71         Passed - Valid encounter within last 6 months    Recent Outpatient Visits           2 months ago Rib pain   [place] Susy Frizzle, MD   11 months ago Sore throat   [place] Eulogio Bear, NP   1 year ago Essential hypertension   [place] [facility], Cammie Mcgee, MD   1 year ago Dyspnea on exertion   [place] Susy Frizzle, MD   2 years ago Routine general medical examination at a health care facility   [place], Cammie Mcgee, MD

## 2022-02-12 ENCOUNTER — Other Ambulatory Visit: Payer: Self-pay | Admitting: Family Medicine

## 2022-02-12 NOTE — Telephone Encounter (Signed)
Rx 04/20/21 #90 3RF-1 year Requested Prescriptions  Pending Prescriptions Disp Refills  . meloxicam (MOBIC) 7.5 MG tablet [Pharmacy Med Name: MELOXICAM 7.5 MG TABLET] 90 tablet 3    Sig: TAKE 1 TABLET BY MOUTH EVERY DAY     Analgesics:  COX2 Inhibitors Failed - 02/12/2022 12:25 PM      Failed - Manual Review: Labs are only required if the patient has taken medication for more than 8 weeks.      Failed - HGB in normal range and within 360 days    Hemoglobin  Date Value Ref Range Status  12/10/2020 13.7 11.7 - 15.5 g/dL Final         Failed - Cr in normal range and within 360 days    Creat  Date Value Ref Range Status  12/10/2020 0.80 0.60 - 0.93 mg/dL Final    Comment:    For patients >5 years of age, the reference limit for Creatinine is approximately 13% higher for people identified as African-American. .    Creatinine, Urine  Date Value Ref Range Status  12/10/2020 66 20 - 275 mg/dL Final         Failed - HCT in normal range and within 360 days    HCT  Date Value Ref Range Status  12/10/2020 42.4 35.0 - 45.0 % Final         Failed - AST in normal range and within 360 days    AST  Date Value Ref Range Status  12/10/2020 21 10 - 35 U/L Final         Failed - ALT in normal range and within 360 days    ALT  Date Value Ref Range Status  12/10/2020 19 6 - 29 U/L Final         Failed - eGFR is 30 or above and within 360 days    GFR, Est African American  Date Value Ref Range Status  12/10/2020 84 > OR = 60 mL/min/1.31m Final   GFR, Est Non African American  Date Value Ref Range Status  12/10/2020 72 > OR = 60 mL/min/1.780mFinal         Passed - Patient is not pregnant      Passed - Valid encounter within last 12 months    Recent Outpatient Visits          [place] months ago [place]   [facility]   11 months ago Sore [place]   [facility]   1 year ago Essential hypertension    [facility]   1 year ago Dyspnea on exertion   [facility]   2 years ago Routine general medical examination at a health care facility   [facility] [place]

## 2022-02-18 ENCOUNTER — Telehealth: Payer: Self-pay

## 2022-02-18 ENCOUNTER — Other Ambulatory Visit: Payer: Self-pay

## 2022-02-18 MED ORDER — HYDROCHLOROTHIAZIDE 12.5 MG PO CAPS: ORAL_CAPSULE | ORAL | 1 refills | Status: DC

## 2022-02-18 NOTE — Telephone Encounter (Signed)
Pt needs an annual w/pcp for future med refills

## 2022-03-02 ENCOUNTER — Other Ambulatory Visit (INDEPENDENT_AMBULATORY_CARE_PROVIDER_SITE_OTHER): Payer: Self-pay | Admitting: Nurse Practitioner

## 2022-03-02 DIAGNOSIS — I83893 Varicose veins of bilateral lower extremities with other complications: Secondary | ICD-10-CM

## 2022-03-03 ENCOUNTER — Ambulatory Visit (INDEPENDENT_AMBULATORY_CARE_PROVIDER_SITE_OTHER): Payer: Medicare PPO | Admitting: Nurse Practitioner

## 2022-03-03 ENCOUNTER — Ambulatory Visit (INDEPENDENT_AMBULATORY_CARE_PROVIDER_SITE_OTHER): Payer: Medicare PPO

## 2022-03-03 ENCOUNTER — Encounter (INDEPENDENT_AMBULATORY_CARE_PROVIDER_SITE_OTHER): Payer: Self-pay | Admitting: Nurse Practitioner

## 2022-03-03 DIAGNOSIS — I83893 Varicose veins of bilateral lower extremities with other complications: Secondary | ICD-10-CM | POA: Diagnosis not present

## 2022-03-12 NOTE — Telephone Encounter (Signed)
I have called and made an appt for patient on 03/29/22 @ 8. She had her AWV with the Health Nurse Advisor in April.

## 2022-03-22 ENCOUNTER — Telehealth: Payer: Self-pay

## 2022-03-22 ENCOUNTER — Ambulatory Visit: Payer: Medicare PPO | Admitting: Family Medicine

## 2022-03-22 ENCOUNTER — Encounter (INDEPENDENT_AMBULATORY_CARE_PROVIDER_SITE_OTHER): Payer: Self-pay | Admitting: Nurse Practitioner

## 2022-03-22 VITALS — BP 120/80 | HR 80 | Temp 98.9°F | Ht 63.0 in | Wt 163.8 lb

## 2022-03-22 DIAGNOSIS — J189 Pneumonia, unspecified organism: Secondary | ICD-10-CM

## 2022-03-22 MED ORDER — AMOXICILLIN-POT CLAVULANATE 875-125 MG PO TABS: 1.0000 | ORAL_TABLET | Freq: Two times a day (BID) | ORAL | 0 refills | Status: DC

## 2022-03-22 MED ORDER — HYDROCODONE BIT-HOMATROP MBR 5-1.5 MG/5ML PO SOLN: 5.0000 mL | Freq: Three times a day (TID) | ORAL | 0 refills | Status: DC | PRN

## 2022-03-22 NOTE — Progress Notes (Signed)
Subjective:    Patient ID: Nichole Bryant, female    DOB: 1945-10-28, 76 y.o.   MRN: [id number]  HPI  Patient states that she has been sick for about 2 weeks.  It started is just a viral upper respiratory infection.  She did a negative COVID test at that time.  Her husband also has had the same viral upper respiratory infection.  However her symptoms have gradually worsened.  She initially got better but now they have taken a turn for the worse over the last few days specifically over the weekend.  She reports a cough productive of green sputum.  She reports chest congestion and fatigue and subjective fevers.  She denies any sinus pain or otalgia or sore throat or headache. Past Medical History:  Diagnosis Date   Arthritis    Diabetes mellitus without complication (HCC)    no meds   High cholesterol    Hypertension    Varicose veins of left lower extremity    Past Surgical History:  Procedure Laterality Date   CATARACT EXTRACTION, BILATERAL     COLONOSCOPY     EYE SURGERY N/A    [employer] 10/27/2020   TONSILLECTOMY     TUBAL LIGATION     Current Outpatient Medications on File Prior to Visit  Medication Sig Dispense Refill   atenolol (TENORMIN) 25 MG tablet TAKE 1 TABLET (25 MG TOTAL) BY MOUTH DAILY. 90 tablet 1   clotrimazole-betamethasone (LOTRISONE) cream APPLY TO AFFECTED AREA TWICE A DAY 30 g 0   fluticasone (FLONASE) 50 MCG/ACT nasal spray SPRAY 2 SPRAYS INTO EACH NOSTRIL EVERY DAY 48 mL 2   hydrochlorothiazide (MICROZIDE) 12.5 MG capsule TAKE 1 CAPSULE BY MOUTH EVERY DAY 90 capsule 1   lovastatin (MEVACOR) 20 MG tablet TAKE 1 TABLET BY MOUTH EVERYDAY AT BEDTIME 90 tablet 3   meloxicam (MOBIC) 7.5 MG tablet TAKE 1 TABLET BY MOUTH EVERY DAY 90 tablet 3   Multiple Vitamin (MULTIVITAMIN ADULT PO) Take by mouth daily.     Omega-3 Fatty Acids (FISH OIL) 1000 MG CAPS Take by mouth.     vitamin B-12 (CYANOCOBALAMIN) 1000 MCG tablet Take 5,000 mcg by mouth daily.      No  current facility-administered medications on file prior to visit.   Allergies  Allergen Reactions   Sulfa Antibiotics     Hives, made ears swell   Social History   Socioeconomic History   Marital status: Married    Spouse name: Not on file   Number of children: Not on file   Years of education: Not on file   Highest education level: Not on file  Occupational History   Not on file  Tobacco Use   Smoking status: Never   Smokeless tobacco: Never  Vaping Use   Vaping Use: Never used  Substance and Sexual Activity   Alcohol use: Yes    Comment: wine occassionally   Drug use: No   Sexual activity: Not on file  Other Topics Concern   Not on file  Social History Narrative   Not on file   Social Determinants of Health   Financial Resource Strain: Low Risk  (10/30/2021)   Overall Financial Resource Strain (CARDIA)    Difficulty of Paying Living Expenses: Not hard at all  Food Insecurity: No Food Insecurity (10/30/2021)   Hunger Vital Sign    Worried About Running Out of Food in the Last Year: Never true    [place] in the Last  Year: Never true  Transportation Needs: No Transportation Needs (10/30/2021)   PRAPARE - Hydrologist (Medical): No    Lack of Transportation (Non-Medical): No  Physical Activity: Insufficiently Active (10/30/2021)   Exercise Vital Sign    Days of Exercise per Week: 3 days    Minutes of Exercise per Session: 30 min  Stress: No Stress Concern Present (10/30/2021)   [place]    Feeling of Stress : Not at all  Social Connections: [place] (10/30/2021)   Social Connection and Isolation Panel [NHANES]    Frequency of Communication with Friends and Family: More than three times a week    Frequency of Social Gatherings with Friends and Family: More than three times a week    Attends Religious Services: More than 4 times per year    Active Member of  [employer] or Organizations: Yes    Attends Music therapist: More than 4 times per year    Marital Status: Married  Human resources officer Violence: Not At Risk (10/30/2021)   Humiliation, Afraid, Rape, and Kick questionnaire    Fear of Current or Ex-Partner: No    Emotionally Abused: No    Physically Abused: No    Sexually Abused: No   Family History  Problem Relation Age of Onset   Colon cancer Neg Hx    Esophageal cancer Neg Hx    Rectal cancer Neg Hx    Stomach cancer Neg Hx       Review of Systems     Objective:   Physical Exam Constitutional:      Appearance: She is ill-appearing.  HENT:     Right Ear: Tympanic membrane and ear canal normal.     Left Ear: Tympanic membrane and ear canal normal.     Nose: Congestion present. No rhinorrhea.  Cardiovascular:     Rate and Rhythm: Regular rhythm.     Heart sounds: Normal heart sounds. No murmur heard.    No friction rub. No gallop.  Pulmonary:     Effort: Pulmonary effort is normal. No respiratory distress.     Breath sounds: No stridor. Examination of the right-lower field reveals rales. Rales present. No wheezing or rhonchi.    Abdominal:     General: Abdomen is flat. Bowel sounds are normal. There is no distension.     Palpations: Abdomen is soft.     Tenderness: There is no abdominal tenderness. There is no guarding.  Musculoskeletal:     Cervical back: Neck supple.     Right hip: Tenderness present. No deformity, lacerations, bony tenderness or crepitus. Decreased range of motion.       Legs:  Skin:    Findings: No erythema or rash.  Neurological:     Mental Status: She is alert.      Community acquired pneumonia of right lower lobe of lung I am concerned that the patient is developing pneumonia in the right lower lung.  She has prominent crackles in that area.  Begin Augmentin 875 mg twice daily for 7 days.  Use Hycodan as needed for cough.  I asked the patient to go home and repeat a COVID test just  to ensure that she has not contracted a second infection.

## 2022-03-22 NOTE — Telephone Encounter (Signed)
Pt called back and states that she had a negative Covid test at home. Thank you.

## 2022-03-22 NOTE — Progress Notes (Signed)
Subjective:    Patient ID: Nichole Bryant, female    DOB: [birth year], 76 y.o.   MRN: [id number] Chief Complaint  Patient presents with   Follow-up    Ultrasound follow up    The patient is seen for evaluation of symptomatic varicose veins. The patient relates burning and stinging which worsened steadily throughout the course of the day, particularly with standing. The patient also notes an aching and throbbing pain over the varicosities, particularly with prolonged dependent positions. The symptoms are significantly improved with elevation.  The patient also notes that during hot weather the symptoms are greatly intensified. The patient states the pain from the varicose veins interferes with work, daily exercise, shopping and household maintenance. At this point, the symptoms are persistent and severe enough that they're having a negative impact on lifestyle and are interfering with daily activities.   There is no history of DVT, PE or superficial thrombophlebitis. There is no history of ulceration or hemorrhage. The patient denies a significant family history of varicose veins.   The patient has worn graduated compression in the past. At the present time the patient has not been using over-the-counter analgesics.    Today noninvasive studies show evidence of reflux in the right lower extremity.  There is evidence of prior ablation in the left lower extremity also some minimal reflux noted as well.  No evidence of deep venous insufficiency or superficial venous thrombosis bilaterally.  No DVT noted bilaterally.      Review of Systems  Cardiovascular:  Positive for leg swelling.  All other systems reviewed and are negative.      Objective:   Physical Exam Vitals reviewed.  HENT:     Head: Normocephalic.  Cardiovascular:     Rate and Rhythm: Normal rate.     Pulses: Normal pulses.  Pulmonary:     Effort: Pulmonary effort is normal.  Skin:    General: Skin is warm and dry.   Neurological:     Mental Status: She is alert and oriented to person, place, and time.  Psychiatric:        Mood and Affect: Mood normal.        Behavior: Behavior normal.        Thought Content: Thought content normal.        Judgment: Judgment normal.     BP 138/68 (BP Location: Left Arm)   Pulse 67   Resp 16   Wt 168 lb 12.8 oz (76.6 kg)   BMI 29.90 kg/m   Past Medical History:  Diagnosis Date   Arthritis    Diabetes mellitus without complication (HCC)    no meds   High cholesterol    Hypertension    Varicose veins of left lower extremity     Social History   Socioeconomic History   Marital status: Married    Spouse name: Not on file   Number of children: Not on file   Years of education: Not on file   Highest education level: Not on file  Occupational History   Not on file  Tobacco Use   Smoking status: Never   Smokeless tobacco: Never  Vaping Use   Vaping Use: Never used  Substance and Sexual Activity   Alcohol use: Yes    Comment: wine occassionally   Drug use: No   Sexual activity: Not on file  Other Topics Concern   Not on file  Social History Narrative   Not on file   Social Determinants  of Health   Financial Resource Strain: Low Risk  (10/30/2021)   Overall Financial Resource Strain (CARDIA)    Difficulty of Paying Living Expenses: Not hard at all  Food Insecurity: No Food Insecurity (10/30/2021)   Hunger Vital Sign    Worried About Running Out of Food in the Last Year: Never true    Ran Out of Food in the Last Year: Never true  Transportation Needs: No Transportation Needs (10/30/2021)   PRAPARE - Hydrologist (Medical): No    Lack of Transportation (Non-Medical): No  Physical Activity: Insufficiently Active (10/30/2021)   Exercise Vital Sign    Days of Exercise per Week: 3 days    Minutes of Exercise per Session: 30 min  Stress: No Stress Concern Present (10/30/2021)   [place]    Feeling of Stress : Not at all  Social Connections: Socially Integrated (10/30/2021)   Social Connection and Isolation Panel [NHANES]    Frequency of Communication with Friends and Family: More than three times a week    Frequency of Social Gatherings with Friends and Family: More than three times a week    Attends Religious Services: More than 4 times per year    Active Member of Clubs or Organizations: Yes    Attends Archivist Meetings: More than 4 times per year    Marital Status: Married  Human resources officer Violence: Not At Risk (10/30/2021)   Humiliation, Afraid, Rape, and Kick questionnaire    Fear of Current or Ex-Partner: No    Emotionally Abused: No    Physically Abused: No    Sexually Abused: No    Past Surgical History:  Procedure Laterality Date   CATARACT EXTRACTION, BILATERAL     COLONOSCOPY     EYE SURGERY N/A    [employer] 10/27/2020   TONSILLECTOMY     TUBAL LIGATION      Family History  Problem Relation Age of Onset   Colon cancer Neg Hx    Esophageal cancer Neg Hx    Rectal cancer Neg Hx    Stomach cancer Neg Hx     Allergies  Allergen Reactions   Sulfa Antibiotics     Hives, made ears swell       Latest Ref Rng & Units 12/10/2020    8:24 AM 10/10/2019    9:44 AM 07/21/2018    9:35 AM  CBC  WBC 3.8 - 10.8 Thousand/uL 6.3  6.3  7.6   Hemoglobin 11.7 - 15.5 g/dL 13.7  12.8  13.7   Hematocrit 35.0 - 45.0 % 42.4  39.1  41.6   Platelets 140 - 400 Thousand/uL 198  177  193       CMP     Component Value Date/Time   NA 139 12/10/2020 0824   K 4.2 12/10/2020 0824   CL 101 12/10/2020 0824   CO2 23 12/10/2020 0824   GLUCOSE 121 (H) 12/10/2020 0824   BUN 21 12/10/2020 0824   CREATININE 0.80 12/10/2020 0824   CALCIUM 9.6 12/10/2020 0824   PROT 6.4 12/10/2020 0824   ALBUMIN 4.1 09/22/2015 1004   AST 21 12/10/2020 0824   ALT 19 12/10/2020 0824   ALKPHOS 56 09/22/2015 1004   BILITOT 0.5 12/10/2020 0824    GFRNONAA 72 12/10/2020 0824   GFRAA 84 12/10/2020 0824     No results found.     Assessment & Plan:   1. Varicose veins  of both legs with edema Recommend:  The patient is complaining of varicose veins.    I have had a long discussion with the patient regarding  varicose veins and why they cause symptoms.  Patient will begin wearing graduated compression stockings on a daily basis, beginning first thing in the morning and removing them in the evening. The patient is instructed specifically not to sleep in the stockings.    The patient  will also begin using over-the-counter analgesics such as Motrin 600 mg po TID to help control the symptoms as needed.    In addition, behavioral modification including elevation during the day will be initiated, utilizing a recliner was recommended.  The patient is also instructed to continue exercising such as walking 4-5 times per week.  At this time the patient wishes to continue conservative therapy   The Patient will follow up PRN if the symptoms worsen or they wish to pursue more invasive options. - VAS [place] LOWER EXTREMITY VENOUS REFLUX   Current Outpatient Medications on File Prior to Visit  Medication Sig Dispense Refill   atenolol (TENORMIN) 25 MG tablet TAKE 1 TABLET (25 MG TOTAL) BY MOUTH DAILY. 90 tablet 1   clotrimazole-betamethasone (LOTRISONE) cream APPLY TO AFFECTED AREA TWICE A DAY 30 g 0   fluticasone (FLONASE) 50 MCG/ACT nasal spray SPRAY 2 SPRAYS INTO EACH NOSTRIL EVERY DAY 48 mL 2   hydrochlorothiazide (MICROZIDE) 12.5 MG capsule TAKE 1 CAPSULE BY MOUTH EVERY DAY 90 capsule 1   lovastatin (MEVACOR) 20 MG tablet TAKE 1 TABLET BY MOUTH EVERYDAY AT BEDTIME 90 tablet 3   meloxicam (MOBIC) 7.5 MG tablet TAKE 1 TABLET BY MOUTH EVERY DAY 90 tablet 3   Multiple Vitamin (MULTIVITAMIN ADULT PO) Take by mouth daily.     Omega-3 Fatty Acids (FISH OIL) 1000 MG CAPS Take by mouth.     vitamin B-12 (CYANOCOBALAMIN) 1000 MCG tablet Take 5,000  mcg by mouth daily.      Calcium Carb-Cholecalciferol (CALCIUM 1000 + D PO) Take by mouth daily.  (Patient not taking: Reported on 12/07/2021)     triamcinolone (KENALOG) 0.1 % paste Use as directed 1 application. in the mouth or throat 2 (two) times daily. (Patient not taking: Reported on 12/07/2021) 5 g 3   No current facility-administered medications on file prior to visit.    There are no Patient Instructions on file for this visit. No follow-ups on file.   Kris Hartmann, NP

## 2022-03-29 ENCOUNTER — Ambulatory Visit: Payer: Medicare PPO | Admitting: Family Medicine

## 2022-03-29 VITALS — BP 112/64 | HR 100 | Temp 98.5°F | Ht 63.0 in | Wt 164.0 lb

## 2022-03-29 DIAGNOSIS — E7800 Pure hypercholesterolemia, unspecified: Secondary | ICD-10-CM | POA: Diagnosis not present

## 2022-03-29 DIAGNOSIS — I10 Essential (primary) hypertension: Secondary | ICD-10-CM

## 2022-03-29 DIAGNOSIS — R7303 Prediabetes: Secondary | ICD-10-CM

## 2022-03-29 NOTE — Progress Notes (Signed)
Subjective:    Patient ID: Nichole Bryant, female    DOB: 1945/08/01, 76 y.o.   MRN: [id number]  HPI  03/22/22 Patient states that she has been sick for about 2 weeks.  It started is just a viral upper respiratory infection.  She did a negative COVID test at that time.  Her husband also has had the same viral upper respiratory infection.  However her symptoms have gradually worsened.  She initially got better but now they have taken a turn for the worse over the last few days specifically over the weekend.  She reports a cough productive of green sputum.  She reports chest congestion and fatigue and subjective fevers.  She denies any sinus pain or otalgia or sore throat or headache.  At that time, my plan was:  I am concerned that the patient is developing pneumonia in the right lower lung.  She has prominent crackles in that area.  Begin Augmentin 875 mg twice daily for 7 days.  Use Hycodan as needed for cough.  I asked the patient to go home and repeat a COVID test just to ensure that she has not contracted a second infection.  03/29/22 Fortunately, her cough is improved.  The crackles in the right base of resolved.  She has not had any further fever or chills.  She does not appear as sick as she did last week.  Therefore I believe that the antibiotic is treated the pneumonia effectively.  Unfortunately, yesterday she developed some diarrhea and upset stomach.  She denies any abdominal pain or fever or chills.  Therefore I believe this is most likely due to the antibiotic unfortunately.  She treated yesterday herself with some Imodium and this seemed to help.  Her blood pressure today is outstanding at 112/64.  She has a history of borderline diabetes mellitus and she is due to recheck some fasting lab work.  That was the initial reason for this appointment however I am reassured by her pulmonary exam today Past Medical History:  Diagnosis Date   Arthritis    Diabetes mellitus without  complication (HCC)    no meds   High cholesterol    Hypertension    Varicose veins of left lower extremity    Past Surgical History:  Procedure Laterality Date   CATARACT EXTRACTION, BILATERAL     COLONOSCOPY     EYE SURGERY N/A    [employer] 10/27/2020   TONSILLECTOMY     TUBAL LIGATION     Current Outpatient Medications on File Prior to Visit  Medication Sig Dispense Refill   amoxicillin-clavulanate (AUGMENTIN) 875-125 MG tablet Take 1 tablet by mouth 2 (two) times daily. 14 tablet 0   atenolol (TENORMIN) 25 MG tablet TAKE 1 TABLET (25 MG TOTAL) BY MOUTH DAILY. 90 tablet 1   clotrimazole-betamethasone (LOTRISONE) cream APPLY TO AFFECTED AREA TWICE A DAY 30 g 0   fluticasone (FLONASE) 50 MCG/ACT nasal spray SPRAY 2 SPRAYS INTO EACH NOSTRIL EVERY DAY 48 mL 2   hydrochlorothiazide (MICROZIDE) 12.5 MG capsule TAKE 1 CAPSULE BY MOUTH EVERY DAY 90 capsule 1   HYDROcodone bit-homatropine (HYCODAN) 5-1.5 MG/5ML syrup Take 5 mLs by mouth every 8 (eight) hours as needed for cough. 120 mL 0   lovastatin (MEVACOR) 20 MG tablet TAKE 1 TABLET BY MOUTH EVERYDAY AT BEDTIME 90 tablet 3   meloxicam (MOBIC) 7.5 MG tablet TAKE 1 TABLET BY MOUTH EVERY DAY 90 tablet 3   Multiple Vitamin (MULTIVITAMIN ADULT PO) Take by  mouth daily.     Omega-3 Fatty Acids (FISH OIL) 1000 MG CAPS Take by mouth.     vitamin B-12 (CYANOCOBALAMIN) 1000 MCG tablet Take 5,000 mcg by mouth daily.      No current facility-administered medications on file prior to visit.   Allergies  Allergen Reactions   Sulfa Antibiotics     Hives, made ears swell   Social History   Socioeconomic History   Marital status: Married    Spouse name: Not on file   Number of children: Not on file   Years of education: Not on file   Highest education level: Not on file  Occupational History   Not on file  Tobacco Use   Smoking status: Never   Smokeless tobacco: Never  Vaping Use   Vaping Use: Never used  Substance and Sexual Activity    Alcohol use: Yes    Comment: wine occassionally   Drug use: No   Sexual activity: Not on file  Other Topics Concern   Not on file  Social History Narrative   Not on file   Social Determinants of Health   Financial Resource Strain: Low Risk  (10/30/2021)   Overall Financial Resource Strain (CARDIA)    Difficulty of Paying Living Expenses: Not hard at all  Food Insecurity: No Food Insecurity (10/30/2021)   Hunger Vital Sign    Worried About Running Out of Food in the Last Year: Never true    Ran Out of Food in the Last Year: Never true  Transportation Needs: No Transportation Needs (10/30/2021)   PRAPARE - Hydrologist (Medical): No    Lack of Transportation (Non-Medical): No  Physical Activity: Insufficiently Active (10/30/2021)   Exercise Vital Sign    Days of Exercise per Week: 3 days    Minutes of Exercise per Session: 30 min  Stress: No Stress Concern Present (10/30/2021)   [place]    Feeling of Stress : Not at all  Social Connections: [place] (10/30/2021)   Social Connection and Isolation Panel [NHANES]    Frequency of Communication with Friends and Family: More than three times a week    Frequency of Social Gatherings with Friends and Family: More than three times a week    Attends Religious Services: More than 4 times per year    Active Member of [employer] or Organizations: Yes    Attends Music therapist: More than 4 times per year    Marital Status: Married  Human resources officer Violence: Not At Risk (10/30/2021)   Humiliation, Afraid, Rape, and Kick questionnaire    Fear of Current or Ex-Partner: No    Emotionally Abused: No    Physically Abused: No    Sexually Abused: No   Family History  Problem Relation Age of Onset   Colon cancer Neg Hx    Esophageal cancer Neg Hx    Rectal cancer Neg Hx    Stomach cancer Neg Hx       Review of Systems      Objective:   Physical Exam Constitutional:      General: She is not in acute distress.    Appearance: Normal appearance. She is not ill-appearing or toxic-appearing.  HENT:     Right Ear: Tympanic membrane and ear canal normal.     Left Ear: Tympanic membrane and ear canal normal.     Nose: Congestion present. No rhinorrhea.  Cardiovascular:  Rate and Rhythm: Regular rhythm.     Heart sounds: Normal heart sounds. No murmur heard.    No friction rub. No gallop.  Pulmonary:     Effort: Pulmonary effort is normal. No respiratory distress.     Breath sounds: No stridor. No wheezing, rhonchi or rales.  Abdominal:     General: Abdomen is flat. Bowel sounds are normal. There is no distension.     Palpations: Abdomen is soft.     Tenderness: There is no abdominal tenderness. There is no guarding.  Musculoskeletal:     Cervical back: Neck supple.  Skin:    Findings: No erythema or rash.  Neurological:     Mental Status: She is alert.    Prediabetes - Plan: CBC with Differential/Platelet, Lipid panel, COMPLETE METABOLIC PANEL WITH GFR, Hemoglobin A1c  Essential hypertension - Plan: CBC with Differential/Platelet, Lipid panel, COMPLETE METABOLIC PANEL WITH GFR, Hemoglobin A1c  Pure hypercholesterolemia - Plan: CBC with Differential/Platelet, Lipid panel, COMPLETE METABOLIC PANEL WITH GFR, Hemoglobin A1c Fortunately, I see no evidence of any pneumonia on her physical exam today.  Her oxygen is normal.  She is afebrile.  Her cough is improved.  And the right basilar crackles have resolved.  I did recommend stopping the Augmentin.  She can skip the last dose.  I believe this is the most likely reason she has the upset stomach.  I recommended starting a probiotic and using Imodium for diarrhea.  While the patient is here today I will check a CBC, CMP, A1c, fasting lipid panel.  Her blood pressure is outstanding.  Ideally I like to keep her A1c below 6.5 and her LDL cholesterol below 100.

## 2022-03-30 LAB — COMPLETE METABOLIC PANEL WITH GFR
AG Ratio: 1.9 (calc) (ref 1.0–2.5)
ALT: 15 U/L (ref 6–29)
AST: 16 U/L (ref 10–35)
Albumin: 4.3 g/dL (ref 3.6–5.1)
Alkaline phosphatase (APISO): 74 U/L (ref 37–153)
BUN: 12 mg/dL (ref 7–25)
CO2: 28 mmol/L (ref 20–32)
Calcium: 9.5 mg/dL (ref 8.6–10.4)
Chloride: 100 mmol/L (ref 98–110)
Creat: 0.86 mg/dL (ref 0.60–1.00)
Globulin: 2.3 g/dL (calc) (ref 1.9–3.7)
Glucose, Bld: 128 mg/dL — ABNORMAL HIGH (ref 65–99)
Potassium: 4.2 mmol/L (ref 3.5–5.3)
Sodium: 138 mmol/L (ref 135–146)
Total Bilirubin: 0.6 mg/dL (ref 0.2–1.2)
Total Protein: 6.6 g/dL (ref 6.1–8.1)
eGFR: 70 mL/min/{1.73_m2} (ref >=60)

## 2022-03-30 LAB — CBC WITH DIFFERENTIAL/PLATELET
Absolute Monocytes: 710 cells/uL (ref 200–950)
Basophils Absolute: 18 cells/uL (ref 0–200)
Basophils Relative: 0.2 %
Eosinophils Absolute: 137 cells/uL (ref 15–500)
Eosinophils Relative: 1.5 %
HCT: 42.5 % (ref 35.0–45.0)
Hemoglobin: 13.7 g/dL (ref 11.7–15.5)
Lymphs Abs: 1401 cells/uL (ref 850–3900)
MCH: 28.3 pg (ref 27.0–33.0)
MCHC: 32.2 g/dL (ref 32.0–36.0)
MCV: 87.8 fL (ref 80.0–100.0)
MPV: 10.5 fL (ref 7.5–12.5)
Monocytes Relative: 7.8 %
Neutro Abs: 6834 cells/uL (ref 1500–7800)
Neutrophils Relative %: 75.1 %
Platelets: 205 10*3/uL (ref 140–400)
RBC: 4.84 10*6/uL (ref 3.80–5.10)
RDW: 11.9 % (ref 11.0–15.0)
Total Lymphocyte: 15.4 %
WBC: 9.1 10*3/uL (ref 3.8–10.8)

## 2022-03-30 LAB — LIPID PANEL
Cholesterol: 159 mg/dL (ref <200)
HDL: 52 mg/dL (ref >=50)
LDL Cholesterol (Calc): 89 mg/dL (calc)
Non-HDL Cholesterol (Calc): 107 mg/dL (calc) (ref <130)
Total CHOL/HDL Ratio: 3.1 (calc) (ref <5.0)
Triglycerides: 89 mg/dL (ref <150)

## 2022-03-30 LAB — HEMOGLOBIN A1C
Hgb A1c MFr Bld: 6.1 % of total Hgb — ABNORMAL HIGH (ref <5.7)
Mean Plasma Glucose: 128 mg/dL
eAG (mmol/L): 7.1 mmol/L

## 2022-04-13 ENCOUNTER — Ambulatory Visit (INDEPENDENT_AMBULATORY_CARE_PROVIDER_SITE_OTHER): Payer: Medicare PPO

## 2022-04-13 DIAGNOSIS — I10 Essential (primary) hypertension: Secondary | ICD-10-CM

## 2022-04-13 DIAGNOSIS — Z23 Encounter for immunization: Secondary | ICD-10-CM

## 2022-04-13 DIAGNOSIS — R7303 Prediabetes: Secondary | ICD-10-CM | POA: Diagnosis not present

## 2022-05-03 ENCOUNTER — Encounter (INDEPENDENT_AMBULATORY_CARE_PROVIDER_SITE_OTHER): Payer: Self-pay

## 2022-06-02 ENCOUNTER — Encounter: Payer: Self-pay | Admitting: Family Medicine

## 2022-06-02 ENCOUNTER — Ambulatory Visit: Payer: Medicare PPO | Admitting: Family Medicine

## 2022-06-02 VITALS — BP 114/64 | HR 94 | Temp 98.2°F | Ht 63.0 in | Wt 166.0 lb

## 2022-06-02 DIAGNOSIS — J019 Acute sinusitis, unspecified: Secondary | ICD-10-CM | POA: Diagnosis not present

## 2022-06-02 DIAGNOSIS — B9689 Other specified bacterial agents as the cause of diseases classified elsewhere: Secondary | ICD-10-CM | POA: Diagnosis not present

## 2022-06-02 MED ORDER — HYDROCODONE BIT-HOMATROP MBR 5-1.5 MG/5ML PO SOLN: 5.0000 mL | Freq: Three times a day (TID) | ORAL | 0 refills | Status: DC | PRN

## 2022-06-02 MED ORDER — AMOXICILLIN-POT CLAVULANATE 875-125 MG PO TABS
1.0000 | ORAL_TABLET | Freq: Two times a day (BID) | ORAL | 0 refills | Status: DC
Start: 2022-06-02 — End: 2022-06-15

## 2022-06-02 NOTE — Progress Notes (Signed)
Acute Office Visit  Subjective:     Patient ID: Nichole Bryant, female    DOB: 08/30/1945, 76 y.o.   MRN: [id number]  Chief Complaint  Patient presents with   Follow-up    congestion/coughing up dark green phlegm    HPI Patient is in today for mucopurulent cough for over a week with sinus pressure and nasal congestion. Denies fever, chills, night sweats, shortness of breath, chest pain, headache, ear pain, sore throat. Has tried Mucinex and Hycodan. Her husband is also sick.   Review of Systems  Reason unable to perform ROS: see HPI.  All other systems reviewed and are negative.       Objective:    BP 114/64   Pulse 94   Temp 98.2 F (36.8 C) (Oral)   Ht '5\' 3"'$  (1.6 m)   Wt 166 lb (75.3 kg)   SpO2 99%   BMI 29.41 kg/m    Physical Exam Vitals and nursing note reviewed.  Constitutional:      Appearance: Normal appearance. She is normal weight.  HENT:     Head: Normocephalic and atraumatic.     Right Ear: Tympanic membrane, ear canal and external ear normal.     Left Ear: Tympanic membrane, ear canal and external ear normal.     Nose: Nose normal.     Mouth/Throat:     Mouth: Mucous membranes are moist.     Pharynx: Oropharynx is clear. Posterior oropharyngeal erythema present.  Eyes:     Extraocular Movements: Extraocular movements intact.     Conjunctiva/sclera: Conjunctivae normal.  Cardiovascular:     Rate and Rhythm: Normal rate and regular rhythm.     Pulses: Normal pulses.     Heart sounds: Normal heart sounds.  Pulmonary:     Effort: Pulmonary effort is normal.     Breath sounds: Normal breath sounds.  Musculoskeletal:     Cervical back: Neck supple.  Lymphadenopathy:     Cervical: No cervical adenopathy.  Skin:    General: Skin is warm and dry.  Neurological:     General: No focal deficit present.     Mental Status: She is alert and oriented to person, place, and time. Mental status is at baseline.  Psychiatric:        Mood and  Affect: Mood normal.        Behavior: Behavior normal.        Thought Content: Thought content normal.        Judgment: Judgment normal.     No results found for any visits on 06/02/22.      Assessment & Plan:   Problem List Items Addressed This Visit       Respiratory   Acute bacterial rhinosinusitis - Primary    Patient is exhibiting signs of a viral illness that has progressed to a bacterial infection. Given her history of pneumonia and this persistent mucopurulent cough I will start Augmentin '875mg'$  BID for >7 days. Encouraged to rest and push fluids. May use humidification, Flonase, and use Hycodan for severe coughing spells. Return to office if symptoms worsen or fail to improve.      Relevant Medications   amoxicillin-clavulanate (AUGMENTIN) 875-125 MG tablet   HYDROcodone bit-homatropine (HYCODAN) 5-1.5 MG/5ML syrup    Meds ordered this encounter  Medications   amoxicillin-clavulanate (AUGMENTIN) 875-125 MG tablet    Sig: Take 1 tablet by mouth 2 (two) times daily.    Dispense:  14 tablet    Refill:  0    Order Specific Question:   Supervising Provider    Answer:   Jenna Luo T [[id number]]   HYDROcodone bit-homatropine (HYCODAN) 5-1.5 MG/5ML syrup    Sig: Take 5 mLs by mouth every 8 (eight) hours as needed for cough.    Dispense:  120 mL    Refill:  0    Order Specific Question:   Supervising Provider    Answer:   Jenna Luo T [[id number]]    Return if symptoms worsen or fail to improve.  Rubie Maid, FNP

## 2022-06-02 NOTE — Assessment & Plan Note (Addendum)
Patient is exhibiting signs of a viral illness that has progressed to a bacterial infection. Given her history of pneumonia and this persistent mucopurulent cough I will start Augmentin '875mg'$  BID for >7 days. Encouraged to rest and push fluids. May use humidification, Flonase, and use Hycodan for severe coughing spells. Return to office if symptoms worsen or fail to improve.

## 2022-06-12 ENCOUNTER — Other Ambulatory Visit: Payer: Self-pay | Admitting: Family Medicine

## 2022-06-15 ENCOUNTER — Ambulatory Visit: Payer: Medicare PPO | Admitting: Family Medicine

## 2022-06-15 VITALS — BP 102/62 | HR 109 | Temp 97.6°F | Ht 63.0 in | Wt 165.0 lb

## 2022-06-15 DIAGNOSIS — R0689 Other abnormalities of breathing: Secondary | ICD-10-CM | POA: Diagnosis not present

## 2022-06-15 DIAGNOSIS — J069 Acute upper respiratory infection, unspecified: Secondary | ICD-10-CM

## 2022-06-15 LAB — CBC WITH DIFFERENTIAL/PLATELET
Absolute Monocytes: 775 cells/uL (ref 200–950)
Basophils Absolute: 62 cells/uL (ref 0–200)
Basophils Relative: 0.5 %
Eosinophils Absolute: 381 cells/uL (ref 15–500)
Eosinophils Relative: 3.1 %
HCT: 36.9 % (ref 35.0–45.0)
Hemoglobin: 12.5 g/dL (ref 11.7–15.5)
Lymphs Abs: 1697 cells/uL (ref 850–3900)
MCH: 28.9 pg (ref 27.0–33.0)
MCHC: 33.9 g/dL (ref 32.0–36.0)
MCV: 85.4 fL (ref 80.0–100.0)
MPV: 10.7 fL (ref 7.5–12.5)
Monocytes Relative: 6.3 %
Neutro Abs: 9385 cells/uL — ABNORMAL HIGH (ref 1500–7800)
Neutrophils Relative %: 76.3 %
Platelets: 249 10*3/uL (ref 140–400)
RBC: 4.32 10*6/uL (ref 3.80–5.10)
RDW: 12.1 % (ref 11.0–15.0)
Total Lymphocyte: 13.8 %
WBC: 12.3 10*3/uL — ABNORMAL HIGH (ref 3.8–10.8)

## 2022-06-15 MED ORDER — LEVOFLOXACIN 500 MG PO TABS: 500.0000 mg | ORAL_TABLET | Freq: Every day | ORAL | 0 refills | Status: AC

## 2022-06-15 NOTE — Assessment & Plan Note (Addendum)
Patients exam today is concerning for pneumonia with coarse breath sounds in the upper and lower left lobes and mucopurulent cough. Start Levaquin '500mg'$  daily for 7 days, will draw CBC and obtain CXR. Patient instructed to continue symptomatic management and return to office if symptoms persist or worsen.

## 2022-06-15 NOTE — Progress Notes (Signed)
Acute Office Visit  Subjective:     Patient ID: Nichole Bryant, female    DOB: 12-May-1946, 76 y.o.   MRN: [id number]  Chief Complaint  Patient presents with   Follow-up    still coughing/not feeling any better/green phlem    HPI Patient is in today for mucopurulent cough, congestion, runny nose, fatigue for several weeks. Denies fever, shortness of breath, wheezing, sore throat. Negative covid at home test x2. She stopped her antibiotic after 4 days due to nausea and vomiting. Has been taking Hycodan and Mucinex.  Review of Systems  All other systems reviewed and are negative.   Past Medical History:  Diagnosis Date   Arthritis    Diabetes mellitus without complication (HCC)    no meds   High cholesterol    Hypertension    Varicose veins of left lower extremity    Past Surgical History:  Procedure Laterality Date   CATARACT EXTRACTION, BILATERAL     COLONOSCOPY     EYE SURGERY N/A    [employer] 10/27/2020   TONSILLECTOMY     TUBAL LIGATION     Current Outpatient Medications on File Prior to Visit  Medication Sig Dispense Refill   atenolol (TENORMIN) 25 MG tablet TAKE 1 TABLET (25 MG TOTAL) BY MOUTH DAILY. 90 tablet 1   clotrimazole-betamethasone (LOTRISONE) cream APPLY TO AFFECTED AREA TWICE A DAY 30 g 0   fluticasone (FLONASE) 50 MCG/ACT nasal spray SPRAY 2 SPRAYS INTO EACH NOSTRIL EVERY DAY 48 mL 2   hydrochlorothiazide (MICROZIDE) 12.5 MG capsule TAKE 1 CAPSULE BY MOUTH EVERY DAY 90 capsule 1   HYDROcodone bit-homatropine (HYCODAN) 5-1.5 MG/5ML syrup Take 5 mLs by mouth every 8 (eight) hours as needed for cough. 120 mL 0   lovastatin (MEVACOR) 20 MG tablet TAKE 1 TABLET BY MOUTH EVERYDAY AT BEDTIME 90 tablet 1   meloxicam (MOBIC) 7.5 MG tablet TAKE 1 TABLET BY MOUTH EVERY DAY 90 tablet 3   Multiple Vitamin (MULTIVITAMIN ADULT PO) Take by mouth daily.     Omega-3 Fatty Acids (FISH OIL) 1000 MG CAPS Take by mouth.     vitamin B-12 (CYANOCOBALAMIN) 1000 MCG  tablet Take 5,000 mcg by mouth daily.      No current facility-administered medications on file prior to visit.   Allergies  Allergen Reactions   Sulfa Antibiotics     Hives, made ears swell       Objective:    BP 102/62   Pulse (!) 109   Temp 97.6 F (36.4 C) (Oral)   Ht '5\' 3"'$  (1.6 m)   Wt 165 lb (74.8 kg)   SpO2 97%   BMI 29.23 kg/m    Physical Exam Vitals and nursing note reviewed.  Constitutional:      Appearance: She is normal weight. She is ill-appearing.  HENT:     Head: Normocephalic and atraumatic.     Mouth/Throat:     Mouth: Mucous membranes are moist.     Pharynx: Oropharynx is clear.  Eyes:     Conjunctiva/sclera: Conjunctivae normal.  Cardiovascular:     Rate and Rhythm: Normal rate and regular rhythm.     Pulses: Normal pulses.     Heart sounds: Normal heart sounds.  Pulmonary:     Effort: Pulmonary effort is normal.     Breath sounds: Examination of the left-upper field reveals rhonchi. Examination of the left-lower field reveals rhonchi. Rhonchi present.  Skin:    General: Skin is warm and dry.  Neurological:     General: No focal deficit present.     Mental Status: She is alert and oriented to person, place, and time. Mental status is at baseline.  Psychiatric:        Mood and Affect: Mood normal.        Behavior: Behavior normal.        Thought Content: Thought content normal.        Judgment: Judgment normal.     No results found for any visits on 06/15/22.      Assessment & Plan:   Problem List Items Addressed This Visit       Respiratory   Upper respiratory tract infection - Primary    Patients exam today is concerning for pneumonia with coarse breath sounds in the upper and lower left lobes and mucopurulent cough. Start Levaquin '500mg'$  daily for 7 days, will draw CBC and obtain CXR. Patient instructed to continue symptomatic management and return to office if symptoms persist or worsen.       Relevant Orders   DG Chest 2 View    CBC with Differential/Platelet   Other Visit Diagnoses     Abnormal breath sounds       Relevant Orders   DG Chest 2 View   CBC with Differential/Platelet       Meds ordered this encounter  Medications   levofloxacin (LEVAQUIN) 500 MG tablet    Sig: Take 1 tablet (500 mg total) by mouth daily for 7 days.    Dispense:  7 tablet    Refill:  0    Order Specific Question:   Supervising Provider    Answer:   Jenna Luo T [[id number]]    Return if symptoms worsen or fail to improve.  Rubie Maid, FNP

## 2022-06-16 ENCOUNTER — Ambulatory Visit
Admission: RE | Admit: 2022-06-16 | Discharge: 2022-06-16 | Disposition: A | Discharge status: Home / Self Care | Payer: Medicare PPO | Source: Ambulatory Visit | Attending: Family Medicine | Admitting: Family Medicine

## 2022-06-16 DIAGNOSIS — R059 Cough, unspecified: Secondary | ICD-10-CM | POA: Diagnosis not present

## 2022-06-16 DIAGNOSIS — R0689 Other abnormalities of breathing: Secondary | ICD-10-CM

## 2022-06-16 DIAGNOSIS — J069 Acute upper respiratory infection, unspecified: Secondary | ICD-10-CM

## 2022-07-15 ENCOUNTER — Telehealth: Payer: Medicare PPO | Admitting: Physician Assistant

## 2022-07-15 DIAGNOSIS — B9689 Other specified bacterial agents as the cause of diseases classified elsewhere: Secondary | ICD-10-CM

## 2022-07-15 DIAGNOSIS — J019 Acute sinusitis, unspecified: Secondary | ICD-10-CM | POA: Diagnosis not present

## 2022-07-15 MED ORDER — AMOXICILLIN-POT CLAVULANATE 875-125 MG PO TABS: 1.0000 | ORAL_TABLET | Freq: Two times a day (BID) | ORAL | 0 refills | Status: DC

## 2022-07-15 NOTE — Progress Notes (Signed)
I have spent 5 minutes in review of e-visit questionnaire, review and updating patient chart, medical decision making and response to patient.   Oyindamola Key Cody Abagale Boulos, PA-C    

## 2022-07-15 NOTE — Progress Notes (Signed)
Message sent to patient requesting further input regarding current symptoms. Awaiting patient response.  

## 2022-07-15 NOTE — Progress Notes (Signed)

## 2022-07-16 ENCOUNTER — Telehealth: Payer: Self-pay

## 2022-07-16 ENCOUNTER — Other Ambulatory Visit: Payer: Self-pay | Admitting: Family Medicine

## 2022-07-16 MED ORDER — AZITHROMYCIN 250 MG PO TABS: ORAL_TABLET | ORAL | 0 refills | Status: DC

## 2022-07-16 NOTE — Telephone Encounter (Signed)
Pt called in stating that she had an evisit with dr yesterday for her cold symptoms. Pt stated that the dr prescribed an antibiotic that she forgot she does not do well with. Pt states that med has made her feel even worse. Pt would like to know if pcp would send in another type of antibiotic that she can take. Please advise.  Cb#: [phone]

## 2022-07-19 ENCOUNTER — Other Ambulatory Visit: Payer: Self-pay

## 2022-07-19 ENCOUNTER — Telehealth: Payer: Self-pay | Admitting: Family Medicine

## 2022-07-19 DIAGNOSIS — J029 Acute pharyngitis, unspecified: Secondary | ICD-10-CM

## 2022-07-19 MED ORDER — FLUTICASONE PROPIONATE 50 MCG/ACT NA SUSP: NASAL | 1 refills | Status: DC

## 2022-07-19 NOTE — Telephone Encounter (Signed)
Prescription Request  07/19/2022  Is this a "Controlled Substance" medicine? No  LOV: 03/29/2022  What is the name of the medication or equipment?   fluticasone (FLONASE) 50 MCG/ACT nasal spray   Have you contacted your pharmacy to request a refill? Yes   Which pharmacy would you like this sent to?  [id number]: [phone]: [phone]   Patient notified that their request is being sent to the clinical staff for review and that they should receive a response within 2 business days.   Please advise pharmacist at [phone]

## 2022-08-13 ENCOUNTER — Other Ambulatory Visit: Payer: Self-pay | Admitting: Family Medicine

## 2022-08-13 NOTE — Telephone Encounter (Signed)
Requested Prescriptions  Pending Prescriptions Disp Refills   atenolol (TENORMIN) 25 MG tablet [Pharmacy Med Name: ATENOLOL 25 MG TABLET] 90 tablet 0    Sig: TAKE 1 TABLET (25 MG TOTAL) BY MOUTH DAILY.     Cardiovascular: Beta Blockers 2 Failed - 08/13/2022  4:33 AM      Failed - Valid encounter within last 6 months    Recent Outpatient Visits           8 months ago Rib pain   [place] [facility] Schaumann, Cammie Mcgee, MD   1 year ago Sore throat   [place] Eulogio Bear, NP   1 year ago Essential hypertension   [place] Dennard Schaumann, Cammie Mcgee, MD   2 years ago Dyspnea on exertion   [place] [facility] Schaumann Cammie Mcgee, MD   2 years ago Routine general medical examination at a health care facility   [place], Cammie Mcgee, MD              Passed - Cr in normal range and within 360 days    Creat  Date Value Ref Range Status  03/29/2022 0.86 0.60 - 1.00 mg/dL Final   Creatinine, Urine  Date Value Ref Range Status  12/10/2020 66 20 - 275 mg/dL Final         Passed - Last BP in normal range    BP Readings from Last 1 Encounters:  06/15/22 102/62         Passed - Last Heart Rate in normal range    Pulse Readings from Last 1 Encounters:  06/15/22 (!) 109

## 2022-09-09 ENCOUNTER — Other Ambulatory Visit: Payer: Self-pay | Admitting: Family Medicine

## 2022-09-09 NOTE — Telephone Encounter (Signed)
Prescription Request  09/09/2022  LOV: 03/29/2022  What is the name of the medication or equipment?   hydrochlorothiazide (MICROZIDE) 12.5 MG capsule   Have you contacted your pharmacy to request a refill? Yes   Which pharmacy would you like this sent to?  [id number]Phone: [phone]: [phone]   Patient notified that their request is being sent to the clinical staff for review and that they should receive a response within 2 business days.   Please advise pharmacist.

## 2022-09-09 NOTE — Telephone Encounter (Signed)
Requested medication (s) are due for refill today: yes  Requested medication (s) are on the active medication list: yes   Last refill:  02/18/22 #90 1 refills  Future visit scheduled: no   Notes to clinic:   needs CPE. Per guidelines CPE scheduled by practice. Do you want to refill Rx?     Requested Prescriptions  Pending Prescriptions Disp Refills   hydrochlorothiazide (MICROZIDE) 12.5 MG capsule 90 capsule 1    Sig: TAKE 1 CAPSULE BY MOUTH EVERY DAY     Cardiovascular: Diuretics - Thiazide Failed - 09/09/2022  3:54 PM      Failed - Valid encounter within last 6 months    Recent Outpatient Visits           9 months ago Rib pain   [place] Susy Frizzle, MD   1 year ago Sore throat   [place] Eulogio Bear, NP   1 year ago Essential hypertension   [place] Dennard Schaumann, Cammie Mcgee, MD   2 years ago Dyspnea on exertion   [place] Susy Frizzle, MD   2 years ago Routine general medical examination at a health care facility   [place], Cammie Mcgee, MD              Passed - Cr in normal range and within 180 days    Creat  Date Value Ref Range Status  03/29/2022 0.86 0.60 - 1.00 mg/dL Final   Creatinine, Urine  Date Value Ref Range Status  12/10/2020 66 20 - 275 mg/dL Final         Passed - K in normal range and within 180 days    Potassium  Date Value Ref Range Status  03/29/2022 4.2 3.5 - 5.3 mmol/L Final         Passed - Na in normal range and within 180 days    Sodium  Date Value Ref Range Status  03/29/2022 138 135 - 146 mmol/L Final         Passed - Last BP in normal range    BP Readings from Last 1 Encounters:  06/15/22 102/62

## 2022-09-10 MED ORDER — HYDROCHLOROTHIAZIDE 12.5 MG PO CAPS: ORAL_CAPSULE | ORAL | 0 refills | Status: DC

## 2022-09-27 ENCOUNTER — Ambulatory Visit: Payer: Medicare PPO | Admitting: Family Medicine

## 2022-09-27 VITALS — BP 100/68 | HR 78 | Temp 97.7°F | Ht 63.0 in | Wt 167.0 lb

## 2022-09-27 DIAGNOSIS — I83893 Varicose veins of bilateral lower extremities with other complications: Secondary | ICD-10-CM

## 2022-09-27 NOTE — Assessment & Plan Note (Addendum)
Worsening pain in RLE. No increase in swelling, warmth, or redness. Discussed obtaining an ultrasound to rule out DVT, she declined. Encouraged to use 600mg  TID PO as prescribed previously by V&V and continue compression. Follow up if symptoms worsen or for swelling, redness, and warmth.

## 2022-09-27 NOTE — Progress Notes (Signed)
Acute Office Visit  Subjective:     Patient ID: Nichole Bryant, female    DOB: 18-Jun-1946, 77 y.o.   MRN: [id number]  Chief Complaint  Patient presents with   Acute Visit    phlebitis (?) - lower legs to calf muscles are very red and swollen. Sx for several weeks; worsening - JBG\\\      HPI Patient is in today for months of redness and bumps to her lower extremities that are worsening in pain and burning on the right side. She has been wearing compression stockings. Has PMH of venous stasis. She has seen vein and vascular in the past who recommended compression and Motrin 600mg  TID PRN. She has not tried Motrin. She has had what sounds like bypass graft to left lower extremity. Denies worsening swelling and redness but does endorse some warmth at times.  Review of Systems  All other systems reviewed and are negative.   Past Medical History:  Diagnosis Date   Arthritis    Diabetes mellitus without complication (HCC)    no meds   High cholesterol    Hypertension    Varicose veins of left lower extremity    Past Surgical History:  Procedure Laterality Date   CATARACT EXTRACTION, BILATERAL     COLONOSCOPY     EYE SURGERY N/A    [facility] 10/27/2020   TONSILLECTOMY     TUBAL LIGATION     Current Outpatient Medications on File Prior to Visit  Medication Sig Dispense Refill   atenolol (TENORMIN) 25 MG tablet TAKE 1 TABLET (25 MG TOTAL) BY MOUTH DAILY. 90 tablet 0   clotrimazole-betamethasone (LOTRISONE) cream APPLY TO AFFECTED AREA TWICE A DAY 30 g 0   fluticasone (FLONASE) 50 MCG/ACT nasal spray SPRAY 2 SPRAYS INTO EACH NOSTRIL EVERY DAY 48 mL 1   hydrochlorothiazide (MICROZIDE) 12.5 MG capsule TAKE 1 CAPSULE BY MOUTH EVERY DAY 90 capsule 0   lovastatin (MEVACOR) 20 MG tablet TAKE 1 TABLET BY MOUTH EVERYDAY AT BEDTIME 90 tablet 1   meloxicam (MOBIC) 7.5 MG tablet TAKE 1 TABLET BY MOUTH EVERY DAY 90 tablet 3   Multiple Vitamin (MULTIVITAMIN ADULT PO) Take by mouth  daily.     Omega-3 Fatty Acids (FISH OIL) 1000 MG CAPS Take by mouth.     vitamin B-12 (CYANOCOBALAMIN) 1000 MCG tablet Take 5,000 mcg by mouth daily.      amoxicillin-clavulanate (AUGMENTIN) 875-125 MG tablet Take 1 tablet by mouth 2 (two) times daily. (Patient not taking: Reported on 09/27/2022) 14 tablet 0   azithromycin (ZITHROMAX) 250 MG tablet 2 tabs poqday1, 1 tab poqday 2-5 (Patient not taking: Reported on 09/27/2022) 6 tablet 0   HYDROcodone bit-homatropine (HYCODAN) 5-1.5 MG/5ML syrup Take 5 mLs by mouth every 8 (eight) hours as needed for cough. (Patient not taking: Reported on 09/27/2022) 120 mL 0   No current facility-administered medications on file prior to visit.   Allergies  Allergen Reactions   Sulfa Antibiotics     Hives, made ears swell        Objective:    BP 100/68   Pulse 78   Temp 97.7 F (36.5 C) (Oral)   Ht 5\' 3"  (1.6 m)   Wt 167 lb (75.8 kg)   SpO2 98%   BMI 29.58 kg/m    Physical Exam Vitals and nursing note reviewed.  Constitutional:      Appearance: Normal appearance. She is normal weight.  HENT:     Head: Normocephalic and atraumatic.  Cardiovascular:     Pulses:          Dorsalis pedis pulses are 2+ on the right side and 2+ on the left side.       Posterior tibial pulses are 1+ on the right side and 1+ on the left side.  Musculoskeletal:     Right lower leg: Edema present.     Left lower leg: Edema present.  Skin:    General: Skin is warm and dry.     Coloration: Skin is ashen (bilateral ankles).  Neurological:     General: No focal deficit present.     Mental Status: She is alert and oriented to person, place, and time. Mental status is at baseline.  Psychiatric:        Mood and Affect: Mood normal.        Behavior: Behavior normal.        Thought Content: Thought content normal.        Judgment: Judgment normal.     No results found for any visits on 09/27/22.      Assessment & Plan:   Problem List Items Addressed This  Visit       Cardiovascular and Mediastinum   Varicose veins of both legs with edema - Primary    Worsening pain in RLE. No increase in swelling, warmth, or redness. Discussed obtaining an ultrasound to rule out DVT, she declined. Encouraged to use 600mg  TID PO as prescribed previously by V&V and continue compression. Follow up if symptoms worsen or for swelling, redness, and warmth.       No orders of the defined types were placed in this encounter.   Return if symptoms worsen or fail to improve.  Rubie Maid, FNP

## 2022-10-19 ENCOUNTER — Encounter (INDEPENDENT_AMBULATORY_CARE_PROVIDER_SITE_OTHER): Payer: Medicare PPO | Admitting: Vascular Surgery

## 2022-11-05 ENCOUNTER — Encounter (INDEPENDENT_AMBULATORY_CARE_PROVIDER_SITE_OTHER): Payer: Medicare PPO | Admitting: Vascular Surgery

## 2022-11-08 ENCOUNTER — Other Ambulatory Visit: Payer: Self-pay | Admitting: Family Medicine

## 2022-11-09 NOTE — Telephone Encounter (Signed)
Requested medications are due for refill today.  Unsure  Requested medications are on the active medications list.  no  Last refill. Unsure  Future visit scheduled.   yes  Notes to clinic.  Refill not delegated. Med not on med list.    Requested Prescriptions  Pending Prescriptions Disp Refills   triamcinolone (KENALOG) 0.1 % paste [Pharmacy Med Name: TRIAMCINOLONE 0.1% PASTE] 5 g 3    Sig: USE AS DIRECTED 1 APPLICATION. IN THE MOUTH OR THROAT 2 (TWO) TIMES DAILY.     Off-Protocol Failed - 11/08/2022 12:34 PM      Failed - Medication not assigned to a protocol, review manually.      Passed - Valid encounter within last 12 months    Recent Outpatient Visits           11 months ago Rib pain   [facility] Tanya Nones, Priscille Heidelberg, MD   1 year ago Sore throat   [facility] Valentino Nose, NP   1 year ago Essential hypertension   [facility] Tanya Nones, Priscille Heidelberg, MD   2 years ago Dyspnea on exertion   [facility] Donita Brooks, MD   3 years ago Routine general medical examination at a health care facility   [facility] Pickard, Priscille Heidelberg, MD              Signed Prescriptions Disp Refills   hydrochlorothiazide (MICROZIDE) 12.5 MG capsule 90 capsule 0    Sig: TAKE 1 CAPSULE BY MOUTH EVERY DAY     Cardiovascular: Diuretics - Thiazide Failed - 11/08/2022 12:34 PM      Failed - Cr in normal range and within 180 days    Creat  Date Value Ref Range Status  03/29/2022 0.86 0.60 - 1.00 mg/dL Final   Creatinine, Urine  Date Value Ref Range Status  12/10/2020 66 20 - 275 mg/dL Final         Failed - K in normal range and within 180 days    Potassium  Date Value Ref Range Status  03/29/2022 4.2 3.5 - 5.3 mmol/L Final         Failed - Na in normal range and within 180 days    Sodium  Date Value Ref Range Status  03/29/2022 138 135 - 146 mmol/L Final         Failed - Valid encounter  within last 6 months    Recent Outpatient Visits           11 months ago Rib pain   [facility] Donita Brooks, MD   1 year ago Sore throat   [facility] Valentino Nose, NP   1 year ago Essential hypertension   [facility] Donita Brooks, MD   2 years ago Dyspnea on exertion   [facility] Donita Brooks, MD   3 years ago Routine general medical examination at a health care facility   [facility] Medicine Pickard, Priscille Heidelberg, MD              Passed - Last BP in normal range    BP Readings from Last 1 Encounters:  09/27/22 100/68          atenolol (TENORMIN) 25 MG tablet 90 tablet 0    Sig: TAKE 1 TABLET (25 MG TOTAL) BY MOUTH DAILY.     Cardiovascular: Beta Blockers  2 Failed - 11/08/2022 12:34 PM      Failed - Valid encounter within last 6 months    Recent Outpatient Visits           11 months ago Rib pain   [facility] Pickard, Priscille Heidelberg, MD   1 year ago Sore throat   [facility] Valentino Nose, NP   1 year ago Essential hypertension   [facility] Tanya Nones, Priscille Heidelberg, MD   2 years ago Dyspnea on exertion   [facility] Tanya Nones, Priscille Heidelberg, MD   3 years ago Routine general medical examination at a health care facility   [facility] Medicine Pickard, Priscille Heidelberg, MD              Passed - Cr in normal range and within 360 days    Creat  Date Value Ref Range Status  03/29/2022 0.86 0.60 - 1.00 mg/dL Final   Creatinine, Urine  Date Value Ref Range Status  12/10/2020 66 20 - 275 mg/dL Final         Passed - Last BP in normal range    BP Readings from Last 1 Encounters:  09/27/22 100/68         Passed - Last Heart Rate in normal range    Pulse Readings from Last 1 Encounters:  09/27/22 78

## 2022-11-09 NOTE — Telephone Encounter (Signed)
Requested Prescriptions  Pending Prescriptions Disp Refills   hydrochlorothiazide (MICROZIDE) 12.5 MG capsule [Pharmacy Med Name: HYDROCHLOROTHIAZIDE 12.5 MG CP] 90 capsule 0    Sig: TAKE 1 CAPSULE BY MOUTH EVERY DAY     Cardiovascular: Diuretics - Thiazide Failed - 11/08/2022 12:34 PM      Failed - Cr in normal range and within 180 days    Creat  Date Value Ref Range Status  03/29/2022 0.86 0.60 - 1.00 mg/dL Final   Creatinine, Urine  Date Value Ref Range Status  12/10/2020 66 20 - 275 mg/dL Final         Failed - K in normal range and within 180 days    Potassium  Date Value Ref Range Status  03/29/2022 4.2 3.5 - 5.3 mmol/L Final         Failed - Na in normal range and within 180 days    Sodium  Date Value Ref Range Status  03/29/2022 138 135 - 146 mmol/L Final         Failed - Valid encounter within last 6 months    Recent Outpatient Visits           11 months ago Rib pain   [facility] Donita Brooks, MD   1 year ago Sore throat   [facility] Valentino Nose, NP   1 year ago Essential hypertension   [facility] Donita Brooks, MD   2 years ago Dyspnea on exertion   [facility] Tanya Nones Priscille Heidelberg, MD   3 years ago Routine general medical examination at a health care facility   [facility] Medicine Pickard, Priscille Heidelberg, MD              Passed - Last BP in normal range    BP Readings from Last 1 Encounters:  09/27/22 100/68          atenolol (TENORMIN) 25 MG tablet [Pharmacy Med Name: ATENOLOL 25 MG TABLET] 90 tablet 0    Sig: TAKE 1 TABLET (25 MG TOTAL) BY MOUTH DAILY.     Cardiovascular: Beta Blockers 2 Failed - 11/08/2022 12:34 PM      Failed - Valid encounter within last 6 months    Recent Outpatient Visits           11 months ago Rib pain   [facility] Tanya Nones, Priscille Heidelberg, MD   1 year ago Sore throat   [facility] Valentino Nose,  NP   1 year ago Essential hypertension   [facility] Tanya Nones, Priscille Heidelberg, MD   2 years ago Dyspnea on exertion   [facility] Medicine Tanya Nones Priscille Heidelberg, MD   3 years ago Routine general medical examination at a health care facility   [facility] Medicine Pickard, Priscille Heidelberg, MD              Passed - Cr in normal range and within 360 days    Creat  Date Value Ref Range Status  03/29/2022 0.86 0.60 - 1.00 mg/dL Final   Creatinine, Urine  Date Value Ref Range Status  12/10/2020 66 20 - 275 mg/dL Final         Passed - Last BP in normal range    BP Readings from Last 1 Encounters:  09/27/22 100/68         Passed - Last Heart Rate in normal range  Pulse Readings from Last 1 Encounters:  09/27/22 78          triamcinolone (KENALOG) 0.1 % paste [Pharmacy Med Name: TRIAMCINOLONE 0.1% PASTE] 5 g 3    Sig: USE AS DIRECTED 1 APPLICATION. IN THE MOUTH OR THROAT 2 (TWO) TIMES DAILY.     Off-Protocol Failed - 11/08/2022 12:34 PM      Failed - Medication not assigned to a protocol, review manually.      Passed - Valid encounter within last 12 months    Recent Outpatient Visits           11 months ago Rib pain   [facility] Tanya Nones, Priscille Heidelberg, MD   1 year ago Sore throat   [facility] Valentino Nose, NP   1 year ago Essential hypertension   [facility] Tanya Nones, Priscille Heidelberg, MD   2 years ago Dyspnea on exertion   [facility] Tanya Nones Priscille Heidelberg, MD   3 years ago Routine general medical examination at a health care facility   [facility] Medicine Pickard, Priscille Heidelberg, MD

## 2022-11-11 ENCOUNTER — Ambulatory Visit (INDEPENDENT_AMBULATORY_CARE_PROVIDER_SITE_OTHER): Payer: Medicare PPO

## 2022-11-11 ENCOUNTER — Other Ambulatory Visit: Payer: Self-pay

## 2022-11-11 VITALS — Ht 63.0 in | Wt 167.0 lb

## 2022-11-11 DIAGNOSIS — Z0000 Encounter for general adult medical examination without abnormal findings: Secondary | ICD-10-CM

## 2022-11-11 MED ORDER — TRIAMCINOLONE ACETONIDE 0.1 % MT PSTE: 1.0000 | PASTE | Freq: Two times a day (BID) | OROMUCOSAL | 0 refills | Status: AC

## 2022-11-11 NOTE — Patient Instructions (Addendum)
Nichole Bryant , Thank you for taking time to come for your Medicare Wellness Visit. I appreciate your ongoing commitment to your health goals. Please review the following plan we discussed and let me know if I can assist you in the future.   These are the goals we discussed:  Goals      Exercise 3x per week (30 min per time)     10/30/21-encouraged pt move more.      Prevent falls        This is a list of the screening recommended for you and due dates:  Health Maintenance  Topic Date Due   COVID-19 Vaccine (5 - 2023-24 season) 03/05/2022   Mammogram  03/15/2023*   Flu Shot  02/03/2023   DTaP/Tdap/Td vaccine (3 - Td or Tdap) 07/19/2023   Medicare Annual Wellness Visit  11/11/2023   Pneumonia Vaccine  Completed   DEXA scan (bone density measurement)  Completed   Hepatitis C Screening: USPSTF Recommendation to screen - Ages 38-79 yo.  Completed   Zoster (Shingles) Vaccine  Completed   HPV Vaccine  Aged Out   Colon Cancer Screening  Discontinued  *Topic was postponed. The date shown is not the original due date.    Advanced directives: Information on Advanced Care Planning can be found at [facility] [place] [facility] [place] Advance Health Care Directives Advance Health Care Directives ([email or web address]    Conditions/risks identified: Aim for 30 minutes of exercise or brisk walking, 6-8 glasses of water, and 5 servings of fruits and vegetables each day.  Next appointment: Follow up in one year for your annual wellness visit    Preventive Care 65 Years and Older, Female Preventive care refers to lifestyle choices and visits with your health care provider that can promote health and wellness. What does preventive care include? A yearly physical exam. This is also called an annual well check. Dental exams once or twice a year. Routine eye exams. Ask your health care provider how often you should have your eyes checked. Personal lifestyle choices, including: Daily care of your teeth and  gums. Regular physical activity. Eating a healthy diet. Avoiding tobacco and drug use. Limiting alcohol use. Practicing safe sex. Taking low-dose aspirin every day. Taking vitamin and mineral supplements as recommended by your health care provider. What happens during an annual well check? The services and screenings done by your health care provider during your annual well check will depend on your age, overall health, lifestyle risk factors, and family history of disease. Counseling  Your health care provider may ask you questions about your: Alcohol use. Tobacco use. Drug use. Emotional well-being. Home and relationship well-being. Sexual activity. Eating habits. History of falls. Memory and ability to understand (cognition). Work and work Astronomer. Reproductive health. Screening  You may have the following tests or measurements: Height, weight, and BMI. Blood pressure. Lipid and cholesterol levels. These may be checked every 5 years, or more frequently if you are over 51 years old. Skin check. Lung cancer screening. You may have this screening every year starting at age 29 if you have a 30-pack-year history of smoking and currently smoke or have quit within the past 15 years. Fecal occult blood test (FOBT) of the stool. You may have this test every year starting at age 59. Flexible sigmoidoscopy or colonoscopy. You may have a sigmoidoscopy every 5 years or a colonoscopy every 10 years starting at age 82. Hepatitis C blood test. Hepatitis B blood test. Sexually transmitted disease (STD) testing.  Diabetes screening. This is done by checking your blood sugar (glucose) after you have not eaten for a while (fasting). You may have this done every 1-3 years. Bone density scan. This is done to screen for osteoporosis. You may have this done starting at age 16. Mammogram. This may be done every 1-2 years. Talk to your health care provider about how often you should have regular  mammograms. Talk with your health care provider about your test results, treatment options, and if necessary, the need for more tests. Vaccines  Your health care provider may recommend certain vaccines, such as: Influenza vaccine. This is recommended every year. Tetanus, diphtheria, and acellular pertussis (Tdap, Td) vaccine. You may need a Td booster every 10 years. Zoster vaccine. You may need this after age 79. Pneumococcal 13-valent conjugate (PCV13) vaccine. One dose is recommended after age 45. Pneumococcal polysaccharide (PPSV23) vaccine. One dose is recommended after age 55. Talk to your health care provider about which screenings and vaccines you need and how often you need them. This information is not intended to replace advice given to you by your health care provider. Make sure you discuss any questions you have with your health care provider. Document Released: 07/18/2015 Document Revised: 03/10/2016 Document Reviewed: 04/22/2015 Elsevier Interactive Patient Education  2017 [place] Prevention in the Home Falls can cause injuries. They can happen to people of all ages. There are many things you can do to make your home safe and to help prevent falls. What can I do on the outside of my home? Regularly fix the edges of walkways and driveways and fix any cracks. Remove anything that might make you trip as you walk through a door, such as a raised step or threshold. Trim any bushes or trees on the path to your home. Use bright outdoor lighting. Clear any walking paths of anything that might make someone trip, such as rocks or tools. Regularly check to see if handrails are loose or broken. Make sure that both sides of any steps have handrails. Any raised decks and porches should have guardrails on the edges. Have any leaves, snow, or ice cleared regularly. Use sand or salt on walking paths during winter. Clean up any spills in your garage right away. This includes oil  or grease spills. What can I do in the bathroom? Use night lights. Install grab bars by the toilet and in the tub and shower. Do not use towel bars as grab bars. Use non-skid mats or decals in the tub or shower. If you need to sit down in the shower, use a plastic, non-slip stool. Keep the floor dry. Clean up any water that spills on the floor as soon as it happens. Remove soap buildup in the tub or shower regularly. Attach bath mats securely with double-sided non-slip rug tape. Do not have throw rugs and other things on the floor that can make you trip. What can I do in the bedroom? Use night lights. Make sure that you have a light by your bed that is easy to reach. Do not use any sheets or blankets that are too big for your bed. They should not hang down onto the floor. Have a firm chair that has side arms. You can use this for support while you get dressed. Do not have throw rugs and other things on the floor that can make you trip. What can I do in the kitchen? Clean up any spills right away. Avoid walking on wet floors.  Keep items that you use a lot in easy-to-reach places. If you need to reach something above you, use a strong step stool that has a grab bar. Keep electrical cords out of the way. Do not use floor polish or wax that makes floors slippery. If you must use wax, use non-skid floor wax. Do not have throw rugs and other things on the floor that can make you trip. What can I do with my stairs? Do not leave any items on the stairs. Make sure that there are handrails on both sides of the stairs and use them. Fix handrails that are broken or loose. Make sure that handrails are as long as the stairways. Check any carpeting to make sure that it is firmly attached to the stairs. Fix any carpet that is loose or worn. Avoid having throw rugs at the top or bottom of the stairs. If you do have throw rugs, attach them to the floor with carpet tape. Make sure that you have a light  switch at the top of the stairs and the bottom of the stairs. If you do not have them, ask someone to add them for you. What else can I do to help prevent falls? Wear shoes that: Do not have high heels. Have rubber bottoms. Are comfortable and fit you well. Are closed at the toe. Do not wear sandals. If you use a stepladder: Make sure that it is fully opened. Do not climb a closed stepladder. Make sure that both sides of the stepladder are locked into place. Ask someone to hold it for you, if possible. Clearly mark and make sure that you can see: Any grab bars or handrails. First and last steps. Where the edge of each step is. Use tools that help you move around (mobility aids) if they are needed. These include: Canes. Walkers. Scooters. Crutches. Turn on the lights when you go into a dark area. Replace any light bulbs as soon as they burn out. Set up your furniture so you have a clear path. Avoid moving your furniture around. If any of your floors are uneven, fix them. If there are any pets around you, be aware of where they are. Review your medicines with your doctor. Some medicines can make you feel dizzy. This can increase your chance of falling. Ask your doctor what other things that you can do to help prevent falls. This information is not intended to replace advice given to you by your health care provider. Make sure you discuss any questions you have with your health care provider. Document Released: 04/17/2009 Document Revised: 11/27/2015 Document Reviewed: 07/26/2014 Elsevier Interactive Patient Education  2017 [employer].

## 2022-11-11 NOTE — Telephone Encounter (Signed)
Patient seen for AWV and is requesting refill of Triamcinolone Paste.

## 2022-11-11 NOTE — Progress Notes (Signed)
Subjective:   Nichole Bryant is a 77 y.o. female who presents for Medicare Annual (Subsequent) preventive examination.  I connected with  Nichole Bryant on 11/11/22 by a audio enabled telemedicine application and verified that I am speaking with the correct person using two identifiers.  Patient Location: Home  Provider Location: Office/Clinic  I discussed the limitations of evaluation and management by telemedicine. The patient expressed understanding and agreed to proceed.  Review of Systems     Cardiac Risk Factors include: advanced age (>38men, >63 women);hypertension;dyslipidemia     Objective:    Today's Vitals   11/11/22 0842  Weight: 167 lb (75.8 kg)  Height: 5\' 3"  (1.6 m)   Body mass index is 29.58 kg/m.     11/11/2022    9:21 AM 10/30/2021    2:43 PM 10/27/2020    3:00 PM  Advanced Directives  Does Patient Have a Medical Advance Directive? No No No  Would patient like information on creating a medical advance directive? Yes (MAU/Ambulatory/Procedural Areas - Information given) No - Patient declined No - Patient declined    Current Medications (verified) Outpatient Encounter Medications as of 11/11/2022  Medication Sig   atenolol (TENORMIN) 25 MG tablet TAKE 1 TABLET (25 MG TOTAL) BY MOUTH DAILY.   clotrimazole-betamethasone (LOTRISONE) cream APPLY TO AFFECTED AREA TWICE A DAY   fluticasone (FLONASE) 50 MCG/ACT nasal spray SPRAY 2 SPRAYS INTO EACH NOSTRIL EVERY DAY   hydrochlorothiazide (MICROZIDE) 12.5 MG capsule TAKE 1 CAPSULE BY MOUTH EVERY DAY   lovastatin (MEVACOR) 20 MG tablet TAKE 1 TABLET BY MOUTH EVERYDAY AT BEDTIME   meloxicam (MOBIC) 7.5 MG tablet TAKE 1 TABLET BY MOUTH EVERY DAY   Multiple Vitamin (MULTIVITAMIN ADULT PO) Take by mouth daily.   Omega-3 Fatty Acids (FISH OIL) 1000 MG CAPS Take by mouth.   vitamin B-12 (CYANOCOBALAMIN) 1000 MCG tablet Take 5,000 mcg by mouth daily.    [DISCONTINUED] amoxicillin-clavulanate (AUGMENTIN)  875-125 MG tablet Take 1 tablet by mouth 2 (two) times daily. (Patient not taking: Reported on 09/27/2022)   [DISCONTINUED] azithromycin (ZITHROMAX) 250 MG tablet 2 tabs poqday1, 1 tab poqday 2-5 (Patient not taking: Reported on 09/27/2022)   [DISCONTINUED] HYDROcodone bit-homatropine (HYCODAN) 5-1.5 MG/5ML syrup Take 5 mLs by mouth every 8 (eight) hours as needed for cough. (Patient not taking: Reported on 09/27/2022)   No facility-administered encounter medications on file as of 11/11/2022.    Allergies (verified) Sulfa antibiotics   History: Past Medical History:  Diagnosis Date   Arthritis    Diabetes mellitus without complication (HCC)    no meds   High cholesterol    Hypertension    Varicose veins of left lower extremity    Past Surgical History:  Procedure Laterality Date   CATARACT EXTRACTION, BILATERAL     COLONOSCOPY     EYE SURGERY N/A    [employer] 10/27/2020   TONSILLECTOMY     TUBAL LIGATION     Family History  Problem Relation Age of Onset   Colon cancer Neg Hx    Esophageal cancer Neg Hx    Rectal cancer Neg Hx    Stomach cancer Neg Hx    Social History   Socioeconomic History   Marital status: Married    Spouse name: Not on file   Number of children: Not on file   Years of education: Not on file   Highest education level: Not on file  Occupational History   Not on file  Tobacco Use  Smoking status: Never   Smokeless tobacco: Never  Vaping Use   Vaping Use: Never used  Substance and Sexual Activity   Alcohol use: Yes    Comment: wine occassionally   Drug use: No   Sexual activity: Not on file  Other Topics Concern   Not on file  Social History Narrative   Not on file   Social Determinants of Health   Financial Resource Strain: Low Risk  (11/11/2022)   Overall Financial Resource Strain (CARDIA)    Difficulty of Paying Living Expenses: Not hard at all  Food Insecurity: No Food Insecurity (11/11/2022)   Hunger Vital Sign    Worried About Running  Out of Food in the Last Year: Never true    Ran Out of Food in the Last Year: Never true  Transportation Needs: No Transportation Needs (11/11/2022)   PRAPARE - Administrator, Civil Service (Medical): No    Lack of Transportation (Non-Medical): No  Physical Activity: Sufficiently Active (11/11/2022)   Exercise Vital Sign    Days of Exercise per Week: 5 days    Minutes of Exercise per Session: 30 min  Stress: No Stress Concern Present (11/11/2022)   [employer] of Occupational Health - Occupational Stress Questionnaire    Feeling of Stress : Not at all  Social Connections: Socially Integrated (11/11/2022)   Social Connection and Isolation Panel [NHANES]    Frequency of Communication with Friends and Family: More than three times a week    Frequency of Social Gatherings with Friends and Family: More than three times a week    Attends Religious Services: More than 4 times per year    Active Member of [employer] or Organizations: Yes    Attends Engineer, structural: More than 4 times per year    Marital Status: Married    Tobacco Counseling Counseling given: Not Answered   Clinical Intake:  Pre-visit preparation completed: Yes  Pain : No/denies pain  Diabetes: No  How often do you need to have someone help you when you read instructions, pamphlets, or other written materials from your doctor or pharmacy?: 1 - Never  Diabetic?No   Interpreter Needed?: No  Information entered by :: Kandis Fantasia LPN   Activities of Daily Living    11/11/2022    9:21 AM  In your present state of health, do you have any difficulty performing the following activities:  Hearing? 0  Vision? 0  Difficulty concentrating or making decisions? 0  Walking or climbing stairs? 0  Dressing or bathing? 0  Doing errands, shopping? 0  Preparing Food and eating ? N  Using the Toilet? N  In the past six months, have you accidently leaked urine? N  Do you have problems with loss of bowel  control? N  Managing your Medications? N  Managing your Finances? N  Housekeeping or managing your Housekeeping? N    Patient Care Team: Donita Brooks, MD as PCP - General (Family Medicine) Debbe Odea, MD as PCP - Cardiology (Cardiology)  Indicate any recent Medical Services you may have received from other than [facility] providers in the past year (date may be approximate).     Assessment:   This is a routine wellness examination for [place].  Hearing/Vision screen Hearing Screening - Comments:: Denies hearing difficulties   Vision Screening - Comments:: Wears rx glasses - up to date with routine eye exams    Dietary issues and exercise activities discussed: Current Exercise Habits: Home exercise routine,  Type of exercise: walking;stretching, Time (Minutes): 30, Frequency (Times/Week): 5, Weekly Exercise (Minutes/Week): 150, Intensity: Mild   Goals Addressed   None    Depression Screen    11/11/2022    8:59 AM 06/02/2022    3:45 PM 10/30/2021    2:41 PM 10/27/2020    3:05 PM 10/15/2019    9:33 AM 07/20/2018    9:38 AM 06/07/2017    3:38 PM  PHQ 2/9 Scores  PHQ - 2 Score 0 0 0 0 0 0 0  PHQ- 9 Score  3  0       Fall Risk    11/11/2022    9:21 AM 06/02/2022    3:44 PM 10/30/2021    2:44 PM 10/27/2020    3:02 PM 10/15/2019    9:34 AM  Fall Risk   Falls in the past year? 0 0 1 0 0  Number falls in past yr: 0  1 0   Injury with Fall? 0  0 0   Risk for fall due to : No Fall Risks  Impaired balance/gait;History of fall(s) Impaired balance/gait   Follow up Falls prevention discussed;Education provided;Falls evaluation completed  Falls prevention discussed Falls evaluation completed;Falls prevention discussed Falls evaluation completed    FALL RISK PREVENTION PERTAINING TO THE HOME:  Any stairs in or around the home? Yes  If so, are there any without handrails? No  Home free of loose throw rugs in walkways, pet beds, electrical cords, etc? Yes  Adequate lighting in  your home to reduce risk of falls? Yes   ASSISTIVE DEVICES UTILIZED TO PREVENT FALLS:  Life alert? No  Use of a cane, walker or w/c? No  Grab bars in the bathroom? Yes  Shower chair or bench in shower? No  Elevated toilet seat or a handicapped toilet? Yes   TIMED UP AND GO:  Was the test performed? No . Telephonic visit   Cognitive Function:        11/11/2022    9:21 AM 10/30/2021    2:49 PM  6CIT Screen  What Year? 0 points 0 points  What month? 0 points 0 points  What time? 0 points 0 points  Count back from 20 0 points 0 points  Months in reverse 0 points 0 points  Repeat phrase 0 points 0 points  Total Score 0 points 0 points    Immunizations Immunization History  Administered Date(s) Administered   Fluad Quad(high Dose 65+) 04/24/2020, 03/28/2021, 04/13/2022   Influenza Split 04/25/2013   Influenza, High Dose Seasonal PF 05/03/2018, 03/31/2019   Influenza,inj,Quad PF,6+ Mos 04/22/2015, 04/06/2016   Influenza-Unspecified 05/09/2017, 03/28/2019   PFIZER Comirnaty(Gray Top)Covid-19 Tri-Sucrose Vaccine 07/29/2020   PFIZER(Purple Top)SARS-COV-2 Vaccination 09/29/2019, 10/24/2019   Pfizer Covid-19 Vaccine Bivalent Booster 31yrs & up 07/22/2021   Pneumococcal Conjugate-13 09/22/2015   Pneumococcal Polysaccharide-23 07/07/2012   Td 11/01/2000   Tdap 07/18/2013   Zoster Recombinat (Shingrix) 09/21/2017, 12/30/2017   Zoster, Live 02/11/2014    TDAP status: Up to date  Flu Vaccine status: Up to date  Pneumococcal vaccine status: Up to date  Covid-19 vaccine status: Information provided on how to obtain vaccines.   Qualifies for Shingles Vaccine? Yes   Zostavax completed No   Shingrix Completed?: Yes  Screening Tests Health Maintenance  Topic Date Due   COVID-19 Vaccine (5 - 2023-24 season) 03/05/2022   MAMMOGRAM  03/15/2023 (Originally 07/25/2020)   INFLUENZA VACCINE  02/03/2023   DTaP/Tdap/Td (3 - Td or Tdap) 07/19/2023   Medicare  Annual Wellness (AWV)   11/11/2023   Pneumonia Vaccine 58+ Years old  Completed   DEXA SCAN  Completed   Hepatitis C Screening  Completed   Zoster Vaccines- Shingrix  Completed   HPV VACCINES  Aged Out   COLONOSCOPY (Pts 45-92yrs Insurance coverage will need to be confirmed)  Discontinued    Health Maintenance  Health Maintenance Due  Topic Date Due   COVID-19 Vaccine (5 - 2023-24 season) 03/05/2022    Colorectal cancer screening: No longer required.   Mammogram status: No longer required due to age.  Bone Density status: Completed 08/31/18. Results reflect: Bone density results: NORMAL. Repeat every 5 years.  Lung Cancer Screening: (Low Dose CT Chest recommended if Age 47-80 years, 30 pack-year currently smoking OR have quit w/in 15years.) does not qualify.   Lung Cancer Screening Referral: n/a  Additional Screening:  Hepatitis C Screening: does qualify; Completed 12/10/20  Vision Screening: Recommended annual ophthalmology exams for early detection of glaucoma and other disorders of the eye. Is the patient up to date with their annual eye exam?  Yes  Who is the provider or what is the name of the office in which the patient attends annual eye exams? Unable to provide name  If pt is not established with a provider, would they like to be referred to a provider to establish care? No .   Dental Screening: Recommended annual dental exams for proper oral hygiene  Community Resource Referral / Chronic Care Management: CRR required this visit?  No   CCM required this visit?  No      Plan:     I have personally reviewed and noted the following in the patient's chart:   Medical and social history Use of alcohol, tobacco or illicit drugs  Current medications and supplements including opioid prescriptions. Patient is not currently taking opioid prescriptions. Functional ability and status Nutritional status Physical activity Advanced directives List of other physicians Hospitalizations,  surgeries, and ER visits in previous 12 months Vitals Screenings to include cognitive, depression, and falls Referrals and appointments  In addition, I have reviewed and discussed with patient certain preventive protocols, quality metrics, and best practice recommendations. A written personalized care plan for preventive services as well as general preventive health recommendations were provided to patient.     Durwin Nora, [place]   07/10/1094   Due to this being a virtual visit, the after visit summary with patients personalized plan was offered to patient via mail or my-chart.  Patient would like to access on my-chart  Nurse Notes: No concerns; is requesting a refill of triamcinolone paste

## 2022-11-12 ENCOUNTER — Ambulatory Visit (INDEPENDENT_AMBULATORY_CARE_PROVIDER_SITE_OTHER): Payer: Medicare PPO | Admitting: Vascular Surgery

## 2022-11-12 VITALS — BP 121/72 | HR 74 | Resp 17 | Ht 62.0 in | Wt 165.6 lb

## 2022-11-12 DIAGNOSIS — I10 Essential (primary) hypertension: Secondary | ICD-10-CM

## 2022-11-12 DIAGNOSIS — E7800 Pure hypercholesterolemia, unspecified: Secondary | ICD-10-CM | POA: Diagnosis not present

## 2022-11-12 DIAGNOSIS — R7303 Prediabetes: Secondary | ICD-10-CM

## 2022-11-12 DIAGNOSIS — I83893 Varicose veins of bilateral lower extremities with other complications: Secondary | ICD-10-CM

## 2022-11-12 NOTE — Progress Notes (Addendum)
MRN : [id number]  Nichole Bryant is a 77 y.o. (February 28, 1946) female who presents with chief complaint of  Chief Complaint  Patient presents with   Establish Care  .  History of Present Illness: Patient returns today in follow up of her venous disease.  She was seen almost a year ago and found to have right great saphenous vein reflux.  Her left great saphenous vein had been previously ablated.  She was wearing compression socks daily, elevating her legs, and being quite active and this seemed to have her symptoms under reasonably good control.  She elected not to have anything done at that time which was very reasonable.  Unfortunately, over the past several months she has had worsening symptoms of pain and swelling in the lower extremities particular on the right.  Given her worsening symptoms, she return for evaluation and consideration of treatment for her right great saphenous vein reflux.  She continues to wear her compression stocks.  She is taking anti-inflammatories for discomfort.  She elevates her legs.  Current Outpatient Medications  Medication Sig Dispense Refill   atenolol (TENORMIN) 25 MG tablet TAKE 1 TABLET (25 MG TOTAL) BY MOUTH DAILY. 90 tablet 0   hydrochlorothiazide (MICROZIDE) 12.5 MG capsule TAKE 1 CAPSULE BY MOUTH EVERY DAY 90 capsule 0   meloxicam (MOBIC) 7.5 MG tablet TAKE 1 TABLET BY MOUTH EVERY DAY 90 tablet 3   Multiple Vitamin (MULTIVITAMIN ADULT PO) Take by mouth daily.     Omega-3 Fatty Acids (FISH OIL) 1000 MG CAPS Take by mouth.     triamcinolone (KENALOG) 0.1 % paste Use as directed 1 Application in the mouth or throat 2 (two) times daily. 5 g 0   vitamin B-12 (CYANOCOBALAMIN) 1000 MCG tablet Take 5,000 mcg by mouth daily.      fluticasone (FLONASE) 50 MCG/ACT nasal spray SPRAY 2 SPRAYS INTO EACH NOSTRIL EVERY DAY 48 mL 0   lovastatin (MEVACOR) 20 MG tablet TAKE 1 TABLET BY MOUTH EVERYDAY AT BEDTIME 90 tablet 0   No current facility-administered  medications for this visit.    Past Medical History:  Diagnosis Date   Arthritis    Diabetes mellitus without complication (HCC)    no meds   High cholesterol    Hypertension    Varicose veins of left lower extremity     Past Surgical History:  Procedure Laterality Date   CATARACT EXTRACTION, BILATERAL     COLONOSCOPY     EYE SURGERY N/A    [employer] 10/27/2020   TONSILLECTOMY     TUBAL LIGATION       Social History   Tobacco Use   Smoking status: Never   Smokeless tobacco: Never  Vaping Use   Vaping status: Never Used  Substance Use Topics   Alcohol use: Yes    Comment: wine occassionally   Drug use: No      Family History  Problem Relation Age of Onset   Colon cancer Neg Hx    Esophageal cancer Neg Hx    Rectal cancer Neg Hx    Stomach cancer Neg Hx   No aneurysms   Allergies  Allergen Reactions   Sulfa Antibiotics     Hives, made ears swell     REVIEW OF SYSTEMS (Negative unless checked)  Constitutional: [] Weight loss  [] Fever  [] Chills Cardiac: [] Chest pain   [] Chest pressure   [] Palpitations   [] Shortness of breath when laying flat   [] Shortness of breath at rest   []   Shortness of breath with exertion. Vascular:  [x] Pain in legs with walking   [x] Pain in legs at rest   [] Pain in legs when laying flat   [] Claudication   [] Pain in feet when walking  [] Pain in feet at rest  [] Pain in feet when laying flat   [] History of DVT   [] Phlebitis   [x] Swelling in legs   [x] Varicose veins   [] Non-healing ulcers Pulmonary:   [] Uses home oxygen   [] Productive cough   [] Hemoptysis   [] Wheeze  [] COPD   [] Asthma Neurologic:  [] Dizziness  [] Blackouts   [] Seizures   [] History of stroke   [] History of TIA  [] Aphasia   [] Temporary blindness   [] Dysphagia   [] Weakness or numbness in arms   [] Weakness or numbness in legs Musculoskeletal:  [x] Arthritis   [] Joint swelling   [x] Joint pain   [] Low back pain Hematologic:  [] Easy bruising  [] Easy bleeding   [] Hypercoagulable state    [] Anemic   Gastrointestinal:  [] Blood in stool   [] Vomiting blood  [] Gastroesophageal reflux/heartburn   [] Abdominal pain Genitourinary:  [] Chronic kidney disease   [] Difficult urination  [] Frequent urination  [] Burning with urination   [] Hematuria Skin:  [] Rashes   [] Ulcers   [] Wounds Psychological:  [] History of anxiety   []  History of major depression.  Physical Examination  BP 121/72 (BP Location: Left Arm)   Pulse 74   Resp 17   Ht 5\' 2"  (1.575 m)   Wt 165 lb 9.6 oz (75.1 kg)   BMI 30.29 kg/m  Gen:  WD/WN, NAD.  Appears younger than stated age Head: [place]/AT, No temporalis wasting. Ear/Nose/Throat: Hearing grossly intact, nares w/o erythema or drainage Eyes: Conjunctiva clear. Sclera non-icteric Neck: Supple.  Trachea midline Pulmonary:  Good air movement, no use of accessory muscles.  Cardiac: RRR, no JVD Vascular:  Vessel Right Left  Radial Palpable Palpable                          PT Palpable Palpable  DP Palpable Palpable   Gastrointestinal: soft, non-tender/non-distended. No guarding/reflex.  Musculoskeletal: M/S 5/5 throughout.  No deformity or atrophy.  Diffuse varicosities bilaterally worse on the right than the left.  Mild bilateral lower extremity edema. Neurologic: Sensation grossly intact in extremities.  Symmetrical.  Speech is fluent.  Psychiatric: Judgment intact, Mood & affect appropriate for pt's clinical situation. Dermatologic: No rashes or ulcers noted.  No cellulitis or open wounds.      Labs No results found for this or any previous visit (from the past 2160 hour(s)).  Radiology No results found.  Assessment/Plan  Hypertension blood pressure control important in reducing the progression of atherosclerotic disease. On appropriate oral medications.   Pre-diabetes blood glucose control important in reducing the progression of atherosclerotic disease. Also, involved in wound healing.    High cholesterol lipid control important in  reducing the progression of atherosclerotic disease. Continue statin therapy   Varicose veins of both legs with edema The patient has CEAP class 4 venous insufficiency with persistent symptoms of venous reflux despite appropriate use of compression socks, leg elevation, and activity.  She takes anti-inflammatories for discomfort and the symptoms are worsening and bothersome on a daily basis.  This has been going on for nearly a year now.  At this point, great saphenous vein laser ablation on the right would be recommended.  Risks and benefits are discussed and we will plan on proceeding in the near future.    Barbara Cower  Wyn Quaker, MD  02/08/2023 9:17 AM    This note was created with Dragon medical transcription system.  Any errors from dictation are purely unintentional

## 2022-11-15 NOTE — Assessment & Plan Note (Addendum)
The patient has CEAP class 4 venous insufficiency with persistent symptoms of venous reflux despite appropriate use of compression socks, leg elevation, and activity.  She takes anti-inflammatories for discomfort and the symptoms are worsening and bothersome on a daily basis.  This has been going on for nearly a year now.  At this point, great saphenous vein laser ablation on the right would be recommended.  Risks and benefits are discussed and we will plan on proceeding in the near future.

## 2022-11-15 NOTE — Assessment & Plan Note (Signed)
lipid control important in reducing the progression of atherosclerotic disease. Continue statin therapy  

## 2022-11-15 NOTE — Assessment & Plan Note (Signed)
blood pressure control important in reducing the progression of atherosclerotic disease. On appropriate oral medications.  

## 2022-11-15 NOTE — Assessment & Plan Note (Signed)
blood glucose control important in reducing the progression of atherosclerotic disease. Also, involved in wound healing.   

## 2022-12-08 ENCOUNTER — Other Ambulatory Visit: Payer: Self-pay | Admitting: Family Medicine

## 2022-12-08 NOTE — Telephone Encounter (Signed)
Due to a glitch in the system the LOV for this practice is not recorded accurately.   LOV for check up was 03/29/2022.  So protocol criteria are met so a 90 day supply without refills was given.  Requested Prescriptions  Pending Prescriptions Disp Refills   lovastatin (MEVACOR) 20 MG tablet [Pharmacy Med Name: LOVASTATIN 20 MG TABLET] 90 tablet 0    Sig: TAKE 1 TABLET BY MOUTH EVERYDAY AT BEDTIME     Cardiovascular:  Antilipid - Statins 2 Failed - 12/08/2022  2:34 AM      Failed - Valid encounter within last 12 months    Recent Outpatient Visits           1 year ago Rib pain   [facility] Donita Brooks, MD   1 year ago Sore throat   [facility] Valentino Nose, NP   1 year ago Essential hypertension   [facility] Tanya Nones, Priscille Heidelberg, MD   2 years ago Dyspnea on exertion   [facility] Tanya Nones Priscille Heidelberg, MD   3 years ago Routine general medical examination at a health care facility   [facility] Pickard, Priscille Heidelberg, MD              Failed - Lipid Panel in normal range within the last 12 months    Cholesterol  Date Value Ref Range Status  03/29/2022 159 <200 mg/dL Final   LDL Cholesterol (Calc)  Date Value Ref Range Status  03/29/2022 89 mg/dL (calc) Final    Comment:    Reference range: <100 . Desirable range <100 mg/dL for primary prevention;   <70 mg/dL for patients with CHD or diabetic patients  with > or = 2 CHD risk factors. Marland Kitchen LDL-C is now calculated using the Martin-Hopkins  calculation, which is a validated novel method providing  better accuracy than the Friedewald equation in the  estimation of LDL-C.  Horald Pollen et al. Lenox Ahr. 1610;960(45): 2061-2068  (http://education.[employer].com/faq/FAQ164)    HDL  Date Value Ref Range Status  03/29/2022 52 > OR = 50 mg/dL Final   Triglycerides  Date Value Ref Range Status  03/29/2022 89 <150 mg/dL Final         Passed -  Cr in normal range and within 360 days    Creat  Date Value Ref Range Status  03/29/2022 0.86 0.60 - 1.00 mg/dL Final   Creatinine, Urine  Date Value Ref Range Status  12/10/2020 66 20 - 275 mg/dL Final         Passed - Patient is not pregnant

## 2023-01-12 ENCOUNTER — Other Ambulatory Visit: Payer: Self-pay | Admitting: Family Medicine

## 2023-01-12 DIAGNOSIS — J029 Acute pharyngitis, unspecified: Secondary | ICD-10-CM

## 2023-01-12 NOTE — Telephone Encounter (Signed)
Seen in office 09/27/22 Requested Prescriptions  Pending Prescriptions Disp Refills   fluticasone (FLONASE) 50 MCG/ACT nasal spray [Pharmacy Med Name: FLUTICASONE PROP 50 MCG SPRAY] 48 mL 1    Sig: SPRAY 2 SPRAYS INTO EACH NOSTRIL EVERY DAY     Ear, Nose, and Throat: Nasal Preparations - Corticosteroids Failed - 01/12/2023  2:45 AM      Failed - Valid encounter within last 12 months    Recent Outpatient Visits           1 year ago Rib pain   [facility] Donita Brooks, MD   1 year ago Sore throat   [facility] Valentino Nose, NP   2 years ago Essential hypertension   [facility] Tanya Nones, Priscille Heidelberg, MD   2 years ago Dyspnea on exertion   [facility] Donita Brooks, MD   3 years ago Routine general medical examination at a health care facility   [facility] Pickard, Priscille Heidelberg, MD

## 2023-02-07 ENCOUNTER — Other Ambulatory Visit: Payer: Self-pay | Admitting: Family Medicine

## 2023-02-07 NOTE — Telephone Encounter (Signed)
Prescription Request  02/07/2023  LOV: 03/29/2022  What is the name of the medication or equipment?   hydrochlorothiazide (MICROZIDE) 12.5 MG capsule  **90 day script requested**  Have you contacted your pharmacy to request a refill? Yes   Which pharmacy would you like this sent to?  [id number] [place], [place] - [address] [place] [address] Phone: [phone] Fax: [phone]    Patient notified that their request is being sent to the clinical staff for review and that they should receive a response within 2 business days.   Please advise pharmacist.

## 2023-02-08 ENCOUNTER — Other Ambulatory Visit: Payer: Self-pay | Admitting: Family Medicine

## 2023-02-08 MED ORDER — HYDROCHLOROTHIAZIDE 12.5 MG PO CAPS: ORAL_CAPSULE | ORAL | 0 refills | Status: DC

## 2023-02-08 NOTE — Telephone Encounter (Signed)
Last OV 03/28/23 Requested Prescriptions  Pending Prescriptions Disp Refills   atenolol (TENORMIN) 25 MG tablet [Pharmacy Med Name: ATENOLOL 25 MG TABLET] 90 tablet 0    Sig: TAKE 1 TABLET (25 MG TOTAL) BY MOUTH DAILY.     Cardiovascular: Beta Blockers 2 Failed - 02/08/2023  2:44 AM      Failed - Valid encounter within last 6 months    Recent Outpatient Visits           1 year ago Rib pain   [facility] Tanya Nones, Priscille Heidelberg, MD   1 year ago Sore throat   [facility] Valentino Nose, NP   2 years ago Essential hypertension   [facility] Tanya Nones, Priscille Heidelberg, MD   2 years ago Dyspnea on exertion   [facility] Tanya Nones, Priscille Heidelberg, MD   3 years ago Routine general medical examination at a health care facility   [facility] Pickard, Priscille Heidelberg, MD              Passed - Cr in normal range and within 360 days    Creat  Date Value Ref Range Status  03/29/2022 0.86 0.60 - 1.00 mg/dL Final   Creatinine, Urine  Date Value Ref Range Status  12/10/2020 66 20 - 275 mg/dL Final         Passed - Last BP in normal range    BP Readings from Last 1 Encounters:  11/12/22 121/72         Passed - Last Heart Rate in normal range    Pulse Readings from Last 1 Encounters:  11/12/22 74

## 2023-02-08 NOTE — Telephone Encounter (Signed)
Requested Prescriptions  Pending Prescriptions Disp Refills   hydrochlorothiazide (MICROZIDE) 12.5 MG capsule 90 capsule 0    Sig: TAKE 1 CAPSULE BY MOUTH EVERY DAY     Cardiovascular: Diuretics - Thiazide Failed - 02/07/2023  1:18 PM      Failed - Cr in normal range and within 180 days    Creat  Date Value Ref Range Status  03/29/2022 0.86 0.60 - 1.00 mg/dL Final   Creatinine, Urine  Date Value Ref Range Status  12/10/2020 66 20 - 275 mg/dL Final         Failed - K in normal range and within 180 days    Potassium  Date Value Ref Range Status  03/29/2022 4.2 3.5 - 5.3 mmol/L Final         Failed - Na in normal range and within 180 days    Sodium  Date Value Ref Range Status  03/29/2022 138 135 - 146 mmol/L Final         Failed - Valid encounter within last 6 months    Recent Outpatient Visits           1 year ago Rib pain   [facility] Donita Brooks, MD   1 year ago Sore throat   [facility] Valentino Nose, NP   2 years ago Essential hypertension   [facility] Donita Brooks, MD   2 years ago Dyspnea on exertion   [facility] Donita Brooks, MD   3 years ago Routine general medical examination at a health care facility   [facility] Pickard, Priscille Heidelberg, MD              Passed - Last BP in normal range    BP Readings from Last 1 Encounters:  11/12/22 121/72

## 2023-02-08 NOTE — Telephone Encounter (Signed)
Unable to refill per protocol, Rx request is too soon.Last refill 12/08/22 for 90 days.  Requested Prescriptions  Pending Prescriptions Disp Refills   lovastatin (MEVACOR) 20 MG tablet [Pharmacy Med Name: LOVASTATIN 20 MG TABLET] 90 tablet 0    Sig: TAKE 1 TABLET BY MOUTH EVERYDAY AT BEDTIME     Cardiovascular:  Antilipid - Statins 2 Failed - 02/07/2023 11:18 AM      Failed - Valid encounter within last 12 months    Recent Outpatient Visits           1 year ago Rib pain   [facility] Donita Brooks, MD   1 year ago Sore throat   [facility] Valentino Nose, NP   2 years ago Essential hypertension   [facility] Tanya Nones, Priscille Heidelberg, MD   2 years ago Dyspnea on exertion   [facility] Tanya Nones, Priscille Heidelberg, MD   3 years ago Routine general medical examination at a health care facility   [facility] Pickard, Priscille Heidelberg, MD              Failed - Lipid Panel in normal range within the last 12 months    Cholesterol  Date Value Ref Range Status  03/29/2022 159 <200 mg/dL Final   LDL Cholesterol (Calc)  Date Value Ref Range Status  03/29/2022 89 mg/dL (calc) Final    Comment:    Reference range: <100 . Desirable range <100 mg/dL for primary prevention;   <70 mg/dL for patients with CHD or diabetic patients  with > or = 2 CHD risk factors. Marland Kitchen LDL-C is now calculated using the Martin-Hopkins  calculation, which is a validated novel method providing  better accuracy than the Friedewald equation in the  estimation of LDL-C.  Horald Pollen et al. Lenox Ahr. 2440;102(72): 2061-2068  (http://education.[facility].com/faq/FAQ164)    HDL  Date Value Ref Range Status  03/29/2022 52 > OR = 50 mg/dL Final   Triglycerides  Date Value Ref Range Status  03/29/2022 89 <150 mg/dL Final         Passed - Cr in normal range and within 360 days    Creat  Date Value Ref Range Status  03/29/2022 0.86 0.60 -  1.00 mg/dL Final   Creatinine, Urine  Date Value Ref Range Status  12/10/2020 66 20 - 275 mg/dL Final         Passed - Patient is not pregnant

## 2023-02-14 ENCOUNTER — Other Ambulatory Visit: Payer: Self-pay | Admitting: Family Medicine

## 2023-02-14 NOTE — Telephone Encounter (Signed)
Prescription Request  02/14/2023  LOV: 03/29/2022  What is the name of the medication or equipment? meloxicam (MOBIC) 7.5 MG tablet   Have you contacted your pharmacy to request a refill? Yes   Which pharmacy would you like this sent to?  [id number] [place], [place] - [address] [place] [address] Phone: [phone] Fax: [phone]    Patient notified that their request is being sent to the clinical staff for review and that they should receive a response within 2 business days.   Please advise at [facility] [phone]

## 2023-02-16 MED ORDER — MELOXICAM 7.5 MG PO TABS: 7.5000 mg | ORAL_TABLET | Freq: Every day | ORAL | 0 refills | Status: DC

## 2023-02-16 NOTE — Telephone Encounter (Signed)
Last OV 09/27/22 Requested Prescriptions  Pending Prescriptions Disp Refills   meloxicam (MOBIC) 7.5 MG tablet 90 tablet 3    Sig: Take 1 tablet (7.5 mg total) by mouth daily.     Analgesics:  COX2 Inhibitors Failed - 02/14/2023  3:50 PM      Failed - Manual Review: Labs are only required if the patient has taken medication for more than 8 weeks.      Failed - Valid encounter within last 12 months    Recent Outpatient Visits           1 year ago Rib pain   [facility] Tanya Nones, Priscille Heidelberg, MD   1 year ago Sore throat   [facility] Valentino Nose, NP   2 years ago Essential hypertension   [facility] Tanya Nones, Priscille Heidelberg, MD   2 years ago Dyspnea on exertion   [facility] Donita Brooks, MD   3 years ago Routine general medical examination at a health care facility   [facility] Pickard, Priscille Heidelberg, MD              Passed - HGB in normal range and within 360 days    Hemoglobin  Date Value Ref Range Status  06/15/2022 12.5 11.7 - 15.5 g/dL Final         Passed - Cr in normal range and within 360 days    Creat  Date Value Ref Range Status  03/29/2022 0.86 0.60 - 1.00 mg/dL Final   Creatinine, Urine  Date Value Ref Range Status  12/10/2020 66 20 - 275 mg/dL Final         Passed - HCT in normal range and within 360 days    HCT  Date Value Ref Range Status  06/15/2022 36.9 35.0 - 45.0 % Final         Passed - AST in normal range and within 360 days    AST  Date Value Ref Range Status  03/29/2022 16 10 - 35 U/L Final         Passed - ALT in normal range and within 360 days    ALT  Date Value Ref Range Status  03/29/2022 15 6 - 29 U/L Final         Passed - eGFR is 30 or above and within 360 days    GFR, Est African American  Date Value Ref Range Status  12/10/2020 84 > OR = 60 mL/min/1.79m2 Final   GFR, Est Non African American  Date Value Ref Range Status  12/10/2020 72  > OR = 60 mL/min/1.40m2 Final   eGFR  Date Value Ref Range Status  03/29/2022 70 > OR = 60 mL/min/1.51m2 Final         Passed - Patient is not pregnant

## 2023-03-09 ENCOUNTER — Telehealth (INDEPENDENT_AMBULATORY_CARE_PROVIDER_SITE_OTHER): Payer: Self-pay

## 2023-03-09 NOTE — Telephone Encounter (Signed)
Pt not interested in Laser procedure at this time will call if she changes her mind

## 2023-04-12 ENCOUNTER — Other Ambulatory Visit: Payer: Self-pay | Admitting: Family Medicine

## 2023-04-12 DIAGNOSIS — J029 Acute pharyngitis, unspecified: Secondary | ICD-10-CM

## 2023-04-12 NOTE — Telephone Encounter (Signed)
Requested Prescriptions  Pending Prescriptions Disp Refills   fluticasone (FLONASE) 50 MCG/ACT nasal spray [Pharmacy Med Name: FLUTICASONE PROP 50 MCG SPRAY] 48 mL 0    Sig: SPRAY 2 SPRAYS INTO EACH NOSTRIL EVERY DAY     Ear, Nose, and Throat: Nasal Preparations - Corticosteroids Failed - 04/12/2023  1:37 AM      Failed - Valid encounter within last 12 months    Recent Outpatient Visits           1 year ago Rib pain   [facility] Donita Brooks, MD   2 years ago Sore throat   [facility] Valentino Nose, NP   2 years ago Essential hypertension   [facility] Tanya Nones, Priscille Heidelberg, MD   3 years ago Dyspnea on exertion   [facility] Donita Brooks, MD   3 years ago Routine general medical examination at a health care facility   [facility] Pickard, Priscille Heidelberg, MD

## 2023-05-06 ENCOUNTER — Other Ambulatory Visit: Payer: Self-pay | Admitting: Family Medicine

## 2023-05-06 NOTE — Telephone Encounter (Signed)
OV needed for additional refills, 30 days given until OV can be made.  Requested Prescriptions  Pending Prescriptions Disp Refills   atenolol (TENORMIN) 25 MG tablet [Pharmacy Med Name: ATENOLOL 25 MG TABLET] 30 tablet 0    Sig: TAKE 1 TABLET (25 MG TOTAL) BY MOUTH DAILY.     Cardiovascular: Beta Blockers 2 Failed - 05/06/2023  1:40 AM      Failed - Cr in normal range and within 360 days    Creat  Date Value Ref Range Status  03/29/2022 0.86 0.60 - 1.00 mg/dL Final   Creatinine, Urine  Date Value Ref Range Status  12/10/2020 66 20 - 275 mg/dL Final         Failed - Valid encounter within last 6 months    Recent Outpatient Visits           1 year ago Rib pain   [facility] Donita Brooks, MD   2 years ago Sore throat   [facility] Valentino Nose, NP   2 years ago Essential hypertension   [facility] Tanya Nones, Priscille Heidelberg, MD   3 years ago Dyspnea on exertion   [facility] Tanya Nones Priscille Heidelberg, MD   3 years ago Routine general medical examination at a health care facility   [facility] Medicine Pickard, Priscille Heidelberg, MD              Passed - Last BP in normal range    BP Readings from Last 1 Encounters:  11/12/22 121/72         Passed - Last Heart Rate in normal range    Pulse Readings from Last 1 Encounters:  11/12/22 74

## 2023-05-18 ENCOUNTER — Other Ambulatory Visit: Payer: Self-pay | Admitting: Family Medicine

## 2023-05-18 NOTE — Telephone Encounter (Signed)
Requested medication (s) are due for refill today: yes  Requested medication (s) are on the active medication list: yes  Last refill:  02/16/23 #90/0  Future visit scheduled: no  Notes to clinic:  Unable to refill per protocol due to failed labs, no updated results.      Requested Prescriptions  Pending Prescriptions Disp Refills   meloxicam (MOBIC) 7.5 MG tablet [Pharmacy Med Name: MELOXICAM 7.5 MG TABLET] 90 tablet 0    Sig: TAKE 1 TABLET BY MOUTH EVERY DAY     Analgesics:  COX2 Inhibitors Failed - 05/18/2023  1:26 AM      Failed - Manual Review: Labs are only required if the patient has taken medication for more than 8 weeks.      Failed - Cr in normal range and within 360 days    Creat  Date Value Ref Range Status  03/29/2022 0.86 0.60 - 1.00 mg/dL Final   Creatinine, Urine  Date Value Ref Range Status  12/10/2020 66 20 - 275 mg/dL Final         Failed - AST in normal range and within 360 days    AST  Date Value Ref Range Status  03/29/2022 16 10 - 35 U/L Final         Failed - ALT in normal range and within 360 days    ALT  Date Value Ref Range Status  03/29/2022 15 6 - 29 U/L Final         Failed - eGFR is 30 or above and within 360 days    GFR, Est African American  Date Value Ref Range Status  12/10/2020 84 > OR = 60 mL/min/1.18m2 Final   GFR, Est Non African American  Date Value Ref Range Status  12/10/2020 72 > OR = 60 mL/min/1.70m2 Final   eGFR  Date Value Ref Range Status  03/29/2022 70 > OR = 60 mL/min/1.25m2 Final         Failed - Valid encounter within last 12 months    Recent Outpatient Visits           1 year ago Rib pain   [facility] Donita Brooks, MD   2 years ago Sore throat   [facility] Valentino Nose, NP   2 years ago Essential hypertension   [facility] Tanya Nones, Priscille Heidelberg, MD   3 years ago Dyspnea on exertion   [facility] Tanya Nones, Priscille Heidelberg, MD   3  years ago Routine general medical examination at a health care facility   [facility] Pickard, Priscille Heidelberg, MD              Passed - HGB in normal range and within 360 days    Hemoglobin  Date Value Ref Range Status  06/15/2022 12.5 11.7 - 15.5 g/dL Final         Passed - HCT in normal range and within 360 days    HCT  Date Value Ref Range Status  06/15/2022 36.9 35.0 - 45.0 % Final         Passed - Patient is not pregnant

## 2023-05-25 ENCOUNTER — Telehealth: Payer: Self-pay

## 2023-05-25 NOTE — Telephone Encounter (Signed)
Copied from CRM [id number]. Topic: Clinical - Medication Question >> May 25, 2023 11:21 AM Mosetta Putt H wrote: Reason for CRM: patient needs to have meloxicam (MOBIC) 7.5 MG tablet refilled and not being refilled

## 2023-05-25 NOTE — Telephone Encounter (Signed)
Pharmacy sent script to follow up on refill requested for meloxicam (MOBIC) 7.5 MG tablet  **90 day script requested**  Lov 09/27/2022  Pharmacy:  [id number] [place],  - [address] [place], [place] [address] Phone: [phone]  Fax: [phone] DEA #: [id number]    Please advise pharmacist.

## 2023-05-26 ENCOUNTER — Other Ambulatory Visit: Payer: Self-pay | Admitting: Family Medicine

## 2023-05-26 MED ORDER — MELOXICAM 7.5 MG PO TABS: 7.5000 mg | ORAL_TABLET | Freq: Every day | ORAL | 0 refills | Status: DC

## 2023-05-31 ENCOUNTER — Other Ambulatory Visit: Payer: Self-pay | Admitting: Family Medicine

## 2023-06-01 NOTE — Telephone Encounter (Signed)
Requested medication (s) are due for refill today: yes   Requested medication (s) are on the active medication list: yes   Last refill:  05/06/23 #30 0 refills  Future visit scheduled: no   Notes to clinic:  last OV 03/29/22. Patient needs appt CPE and for medication refills. Pharmacy comment : REQUEST FOR 90 DAYS PRESCRIPTION.      Requested Prescriptions  Pending Prescriptions Disp Refills   atenolol (TENORMIN) 25 MG tablet [Pharmacy Med Name: ATENOLOL 25 MG TABLET] 90 tablet 1    Sig: TAKE 1 TABLET (25 MG TOTAL) BY MOUTH DAILY.     Cardiovascular: Beta Blockers 2 Failed - 05/31/2023  1:30 PM      Failed - Cr in normal range and within 360 days    Creat  Date Value Ref Range Status  03/29/2022 0.86 0.60 - 1.00 mg/dL Final   Creatinine, Urine  Date Value Ref Range Status  12/10/2020 66 20 - 275 mg/dL Final         Failed - Valid encounter within last 6 months    Recent Outpatient Visits           1 year ago Rib pain   [facility] Donita Brooks, MD   2 years ago Sore throat   [facility] Valentino Nose, NP   2 years ago Essential hypertension   [facility] Tanya Nones, Priscille Heidelberg, MD   3 years ago Dyspnea on exertion   [facility] Donita Brooks, MD   3 years ago Routine general medical examination at a health care facility   [facility] Pickard, Priscille Heidelberg, MD       Future Appointments             In 2 months Agbor-Etang, Arlys John, MD [facility] - Last BP in normal range    BP Readings from Last 1 Encounters:  11/12/22 121/72         Passed - Last Heart Rate in normal range    Pulse Readings from Last 1 Encounters:  11/12/22 74

## 2023-06-03 ENCOUNTER — Other Ambulatory Visit: Payer: Self-pay | Admitting: Family Medicine

## 2023-06-06 NOTE — Telephone Encounter (Signed)
Requested medication (s) are due for refill today: yes   Requested medication (s) are on the active medication list: yes   Last refill:  12/08/22 #90 0 refills   Future visit scheduled: no   Notes to clinic:  protocol failed. Last labs 03/29/22. Needs CPE scheduled. Do you want to refill Rx?     Requested Prescriptions  Pending Prescriptions Disp Refills   lovastatin (MEVACOR) 20 MG tablet [Pharmacy Med Name: LOVASTATIN 20 MG TABLET] 90 tablet 0    Sig: TAKE 1 TABLET BY MOUTH EVERYDAY AT BEDTIME     Cardiovascular:  Antilipid - Statins 2 Failed - 06/06/2023  3:42 PM      Failed - Cr in normal range and within 360 days    Creat  Date Value Ref Range Status  03/29/2022 0.86 0.60 - 1.00 mg/dL Final   Creatinine, Urine  Date Value Ref Range Status  12/10/2020 66 20 - 275 mg/dL Final         Failed - Valid encounter within last 12 months    Recent Outpatient Visits           1 year ago Rib pain   [facility] Donita Brooks, MD   2 years ago Sore throat   [facility] Valentino Nose, NP   2 years ago Essential hypertension   [facility] Tanya Nones, Priscille Heidelberg, MD   3 years ago Dyspnea on exertion   [facility] Tanya Nones Priscille Heidelberg, MD   3 years ago Routine general medical examination at a health care facility   [facility] Pickard, Priscille Heidelberg, MD       Future Appointments             In 1 month Agbor-Etang, Arlys John, MD [facility]            Failed - Lipid Panel in normal range within the last 12 months    Cholesterol  Date Value Ref Range Status  03/29/2022 159 <200 mg/dL Final   LDL Cholesterol (Calc)  Date Value Ref Range Status  03/29/2022 89 mg/dL (calc) Final    Comment:    Reference range: <100 . Desirable range <100 mg/dL for primary prevention;   <70 mg/dL for patients with CHD or diabetic patients  with > or = 2 CHD risk factors. Marland Kitchen LDL-C is now  calculated using the Martin-Hopkins  calculation, which is a validated novel method providing  better accuracy than the Friedewald equation in the  estimation of LDL-C.  Horald Pollen et al. Lenox Ahr. 7829;562(13): 2061-2068  (http://education.[employer].com/faq/FAQ164)    HDL  Date Value Ref Range Status  03/29/2022 52 > OR = 50 mg/dL Final   Triglycerides  Date Value Ref Range Status  03/29/2022 89 <150 mg/dL Final         Passed - Patient is not pregnant

## 2023-06-08 ENCOUNTER — Telehealth: Payer: Self-pay | Admitting: Family Medicine

## 2023-06-08 ENCOUNTER — Other Ambulatory Visit: Payer: Self-pay

## 2023-06-08 DIAGNOSIS — I10 Essential (primary) hypertension: Secondary | ICD-10-CM

## 2023-06-08 MED ORDER — HYDROCHLOROTHIAZIDE 12.5 MG PO CAPS: ORAL_CAPSULE | ORAL | 0 refills | Status: DC

## 2023-06-08 NOTE — Telephone Encounter (Signed)
Prescription Request  06/08/2023  LOV: Visit date not found  What is the name of the medication or equipment? hydrochlorothiazide (MICROZIDE) 12.5 MG capsule   Have you contacted your pharmacy to request a refill? Yes   Which pharmacy would you like this sent to?  [id number] [place], [place] - [address] [place] [address] Phone: [phone] Fax: [phone]    Patient notified that their request is being sent to the clinical staff for review and that they should receive a response within 2 business days.   Please advise at [facility] [phone]

## 2023-06-14 ENCOUNTER — Other Ambulatory Visit: Payer: Self-pay | Admitting: Family Medicine

## 2023-06-14 MED ORDER — MELOXICAM 7.5 MG PO TABS: 7.5000 mg | ORAL_TABLET | Freq: Every day | ORAL | 0 refills | Status: DC

## 2023-06-24 ENCOUNTER — Ambulatory Visit: Payer: Self-pay | Admitting: Family Medicine

## 2023-06-24 NOTE — Telephone Encounter (Signed)
Copied from CRM [id number]. Topic: Clinical - Red Word Triage >> Jun 24, 2023  8:44 AM Fuller Mandril wrote: Red Word that prompted transfer to Nurse Triage: Bleeding nose, Dark green mucus, OTC medication not working - lasting longer than a week, worsening   Chief Complaint: Cold Symptoms: Productive cough, green nasal drainage, nasal scabs with nose bleeds Frequency: Ongoing since last Thursday Pertinent Negatives: Patient denies fever at this time Disposition: [] ED /[] Urgent Care (no appt availability in office) / [x] Appointment(In office/virtual)/ []  [facility]/ [] Home Care/ [] Refused Recommended Disposition /[] [place] [facility]/ []  Follow-up with PCP  Additional Notes: Patient called today reporting cold symptoms that started last Thursday. She has been taking Sudafed which helps but has not resolved her symptoms. Per protocol, this RN instructed patient that she should be seen within 3 days. Since there were no appointments available today, this RN recommended going to urgent care today or over the weekend. Patient stated she may try urgent care, but she prefers having an appointment on Monday. Appointment has been scheduled.   Reason for Disposition  Sores with yellow scabs around the nasal opening  Answer Assessment - Initial Assessment Questions 1. ONSET: "When did the nasal discharge start?"      Last Thursday  3. COUGH: "Do you have a cough?" If Yes, ask: "Describe the color of your sputum" (clear, white, yellow, green)     Yes, Green sputum  4. RESPIRATORY DISTRESS: "Describe your breathing."      Breathing through mouth because nose is stuffy  5. FEVER: "Do you have a fever?" If Yes, ask: "What is your temperature, how was it measured, and when did it start?"     No  6. SEVERITY: "Overall, how bad are you feeling right now?" (e.g., doesn't interfere with normal activities, staying home from school/work, staying in bed)      Low energy but still trying to do  normal activities  7. OTHER SYMPTOMS: "Do you have any other symptoms?" (e.g., sore throat, earache, wheezing, vomiting)     Discomfort in head. Nose bleed, sores in nose (usually blood on tissue on when wiping nose). Patient also reports green nasal discharge  Protocols used: Common Cold-A-AH

## 2023-06-27 ENCOUNTER — Encounter: Payer: Self-pay | Admitting: Family Medicine

## 2023-06-27 ENCOUNTER — Ambulatory Visit: Payer: Medicare PPO | Admitting: Family Medicine

## 2023-06-27 VITALS — BP 124/80 | HR 68 | Temp 98.5°F | Ht 62.0 in | Wt 164.0 lb

## 2023-06-27 DIAGNOSIS — J019 Acute sinusitis, unspecified: Secondary | ICD-10-CM | POA: Diagnosis not present

## 2023-06-27 DIAGNOSIS — B9689 Other specified bacterial agents as the cause of diseases classified elsewhere: Secondary | ICD-10-CM

## 2023-06-27 DIAGNOSIS — R739 Hyperglycemia, unspecified: Secondary | ICD-10-CM | POA: Diagnosis not present

## 2023-06-27 MED ORDER — AMOXICILLIN-POT CLAVULANATE 875-125 MG PO TABS: 1.0000 | ORAL_TABLET | Freq: Two times a day (BID) | ORAL | 0 refills | Status: DC

## 2023-06-27 MED ORDER — PREDNISONE 20 MG PO TABS: ORAL_TABLET | ORAL | 0 refills | Status: DC

## 2023-06-27 NOTE — Progress Notes (Signed)
Subjective:    Patient ID: Nichole Bryant, female    DOB: 05/08/1946, 77 y.o.   MRN: [id number]  Sinus Problem  Patient states she was sick for 12 days.  She reports pain pressure..  She reports green purulent mucus when she was her nose.  She reports headaches.  She reports fevers body aches and fatigue.  She also reports a cough which is nonproductive. Past Medical History:  Diagnosis Date   Arthritis    Diabetes mellitus without complication (HCC)    no meds   High cholesterol    Hypertension    Varicose veins of left lower extremity    Past Surgical History:  Procedure Laterality Date   CATARACT EXTRACTION, BILATERAL     COLONOSCOPY     EYE SURGERY N/A    [employer] 10/27/2020   TONSILLECTOMY     TUBAL LIGATION     Current Outpatient Medications on File Prior to Visit  Medication Sig Dispense Refill   atenolol (TENORMIN) 25 MG tablet TAKE 1 TABLET (25 MG TOTAL) BY MOUTH DAILY. 30 tablet 0   fluticasone (FLONASE) 50 MCG/ACT nasal spray SPRAY 2 SPRAYS INTO EACH NOSTRIL EVERY DAY 48 mL 0   hydrochlorothiazide (MICROZIDE) 12.5 MG capsule TAKE 1 CAPSULE BY MOUTH EVERY DAY 30 capsule 0   lovastatin (MEVACOR) 20 MG tablet TAKE 1 TABLET BY MOUTH EVERYDAY AT BEDTIME 90 tablet 0   meloxicam (MOBIC) 7.5 MG tablet Take 1 tablet (7.5 mg total) by mouth daily. 90 tablet 0   Multiple Vitamin (MULTIVITAMIN ADULT PO) Take by mouth daily.     Omega-3 Fatty Acids (FISH OIL) 1000 MG CAPS Take by mouth.     triamcinolone (KENALOG) 0.1 % paste Use as directed 1 Application in the mouth or throat 2 (two) times daily. 5 g 0   vitamin B-12 (CYANOCOBALAMIN) 1000 MCG tablet Take 5,000 mcg by mouth daily.      No current facility-administered medications on file prior to visit.   Allergies  Allergen Reactions   Sulfa Antibiotics     Hives, made ears swell   Social History   Socioeconomic History   Marital status: Married    Spouse name: Not on file   Number of children: Not on file    Years of education: Not on file   Highest education level: Not on file  Occupational History   Not on file  Tobacco Use   Smoking status: Never   Smokeless tobacco: Never  Vaping Use   Vaping status: Never Used  Substance and Sexual Activity   Alcohol use: Yes    Comment: wine occassionally   Drug use: No   Sexual activity: Not on file  Other Topics Concern   Not on file  Social History Narrative   Not on file   Social Drivers of Health   Financial Resource Strain: Low Risk  (11/11/2022)   Overall Financial Resource Strain (CARDIA)    Difficulty of Paying Living Expenses: Not hard at all  Food Insecurity: No Food Insecurity (11/11/2022)   Hunger Vital Sign    Worried About Running Out of Food in the Last Year: Never true    Ran Out of Food in the Last Year: Never true  Transportation Needs: No Transportation Needs (11/11/2022)   PRAPARE - Administrator, Civil Service (Medical): No    Lack of Transportation (Non-Medical): No  Physical Activity: Sufficiently Active (11/11/2022)   Exercise Vital Sign    Days of Exercise per  Week: 5 days    Minutes of Exercise per Session: 30 min  Stress: No Stress Concern Present (11/11/2022)   [employer] of Occupational Health - Occupational Stress Questionnaire    Feeling of Stress : Not at all  Social Connections: Socially Integrated (11/11/2022)   Social Connection and Isolation Panel [NHANES]    Frequency of Communication with Friends and Family: More than three times a week    Frequency of Social Gatherings with Friends and Family: More than three times a week    Attends Religious Services: More than 4 times per year    Active Member of [employer] or Organizations: Yes    Attends Engineer, structural: More than 4 times per year    Marital Status: Married  Catering manager Violence: Not At Risk (11/11/2022)   Humiliation, Afraid, Rape, and Kick questionnaire    Fear of Current or Ex-Partner: No    Emotionally Abused: No     Physically Abused: No    Sexually Abused: No   Family History  Problem Relation Age of Onset   Colon cancer Neg Hx    Esophageal cancer Neg Hx    Rectal cancer Neg Hx    Stomach cancer Neg Hx       Review of Systems     Objective:   Physical Exam Constitutional:      General: She is not in acute distress.    Appearance: Normal appearance. She is not ill-appearing or toxic-appearing.  HENT:     Right Ear: Tympanic membrane and ear canal normal.     Left Ear: Tympanic membrane and ear canal normal.     Nose: Congestion and rhinorrhea present.     Right Sinus: Frontal sinus tenderness present.     Left Sinus: Frontal sinus tenderness present.  Cardiovascular:     Rate and Rhythm: Regular rhythm.     Heart sounds: Normal heart sounds. No murmur heard.    No friction rub. No gallop.  Pulmonary:     Effort: Pulmonary effort is normal. No respiratory distress.     Breath sounds: No stridor. No wheezing, rhonchi or rales.  Abdominal:     General: Abdomen is flat. Bowel sounds are normal. There is no distension.     Palpations: Abdomen is soft.     Tenderness: There is no abdominal tenderness. There is no guarding.  Musculoskeletal:     Cervical back: Neck supple.  Skin:    Findings: No erythema or rash.  Neurological:     Mental Status: She is alert.    Acute bacterial rhinosinusitis - Plan: CBC with Differential/Platelet, COMPLETE METABOLIC PANEL WITH GFR Patient is overdue for lab work.  I will check a CBC and a CMP while the patient is here today.  Meanwhile we will treat her for sinusitis with Augmentin 875 mg twice daily for 10 days and a prednisone taper pack.  Reassess in 1 week or sooner if worsening

## 2023-07-05 LAB — COMPLETE METABOLIC PANEL WITH GFR
AG Ratio: 1.9 (calc) (ref 1.0–2.5)
ALT: 17 U/L (ref 6–29)
AST: 24 U/L (ref 10–35)
Albumin: 4.6 g/dL (ref 3.6–5.1)
Alkaline phosphatase (APISO): 61 U/L (ref 37–153)
BUN: 16 mg/dL (ref 7–25)
CO2: 29 mmol/L (ref 20–32)
Calcium: 9.3 mg/dL (ref 8.6–10.4)
Chloride: 101 mmol/L (ref 98–110)
Creat: 0.77 mg/dL (ref 0.60–1.00)
Globulin: 2.4 g/dL (ref 1.9–3.7)
Glucose, Bld: 125 mg/dL — ABNORMAL HIGH (ref 65–99)
Potassium: 3.9 mmol/L (ref 3.5–5.3)
Sodium: 139 mmol/L (ref 135–146)
Total Bilirubin: 0.5 mg/dL (ref 0.2–1.2)
Total Protein: 7 g/dL (ref 6.1–8.1)
eGFR: 79 mL/min/{1.73_m2} (ref >=60)

## 2023-07-05 LAB — HEMOGLOBIN A1C
Hgb A1c MFr Bld: 6.2 %{Hb} — ABNORMAL HIGH (ref <5.7)
Mean Plasma Glucose: 131 mg/dL
eAG (mmol/L): 7.3 mmol/L

## 2023-07-05 LAB — CBC WITH DIFFERENTIAL/PLATELET
Absolute Lymphocytes: 1751 {cells}/uL (ref 850–3900)
Absolute Monocytes: 655 {cells}/uL (ref 200–950)
Basophils Absolute: 43 {cells}/uL (ref 0–200)
Basophils Relative: 0.5 %
Eosinophils Absolute: 111 {cells}/uL (ref 15–500)
Eosinophils Relative: 1.3 %
HCT: 41.4 % (ref 35.0–45.0)
Hemoglobin: 13.4 g/dL (ref 11.7–15.5)
MCH: 28.2 pg (ref 27.0–33.0)
MCHC: 32.4 g/dL (ref 32.0–36.0)
MCV: 87.2 fL (ref 80.0–100.0)
MPV: 10.5 fL (ref 7.5–12.5)
Monocytes Relative: 7.7 %
Neutro Abs: 5942 {cells}/uL (ref 1500–7800)
Neutrophils Relative %: 69.9 %
Platelets: 254 10*3/uL (ref 140–400)
RBC: 4.75 10*6/uL (ref 3.80–5.10)
RDW: 11.9 % (ref 11.0–15.0)
Total Lymphocyte: 20.6 %
WBC: 8.5 10*3/uL (ref 3.8–10.8)

## 2023-07-05 LAB — TEST AUTHORIZATION

## 2023-07-09 ENCOUNTER — Other Ambulatory Visit: Payer: Self-pay | Admitting: Family Medicine

## 2023-07-09 DIAGNOSIS — J029 Acute pharyngitis, unspecified: Secondary | ICD-10-CM

## 2023-08-01 ENCOUNTER — Encounter: Payer: Self-pay | Admitting: Cardiology

## 2023-08-01 ENCOUNTER — Ambulatory Visit: Payer: Medicare PPO | Attending: Cardiology | Admitting: Cardiology

## 2023-08-01 VITALS — BP 118/60 | HR 68 | Ht 63.0 in | Wt 165.6 lb

## 2023-08-01 DIAGNOSIS — I83893 Varicose veins of bilateral lower extremities with other complications: Secondary | ICD-10-CM | POA: Diagnosis not present

## 2023-08-01 DIAGNOSIS — I10 Essential (primary) hypertension: Secondary | ICD-10-CM | POA: Diagnosis not present

## 2023-08-01 DIAGNOSIS — R0681 Apnea, not elsewhere classified: Secondary | ICD-10-CM | POA: Diagnosis not present

## 2023-08-01 DIAGNOSIS — E7800 Pure hypercholesterolemia, unspecified: Secondary | ICD-10-CM | POA: Diagnosis not present

## 2023-08-01 NOTE — Patient Instructions (Signed)
Medication Instructions:   Your physician recommends that you continue on your current medications as directed. Please refer to the Current Medication list given to you today.  *If you need a refill on your cardiac medications before your next appointment, please call your pharmacy*   Lab Work:  None Ordered  If you have labs (blood work) drawn today and your tests are completely normal, you will receive your results only by: MyChart Message (if you have MyChart) OR A paper copy in the mail If you have any lab test that is abnormal or we need to change your treatment, we will call you to review the results.   Testing/Procedures:  None Ordered   Follow-Up: At [facility], you and your health needs are our priority.  As part of our continuing mission to provide you with exceptional heart care, we have created designated Provider Care Teams.  These Care Teams include your primary Cardiologist (physician) and Advanced Practice Providers (APPs -  Physician Assistants and Nurse Practitioners) who all work together to provide you with the care you need, when you need it.  We recommend signing up for the patient portal called "MyChart".  Sign up information is provided on this After Visit Summary.  MyChart is used to connect with patients for Virtual Visits (Telemedicine).  Patients are able to view lab/test results, encounter notes, upcoming appointments, etc.  Non-urgent messages can be sent to your provider as well.   To learn more about what you can do with MyChart, go to [email or web address].    Your next appointment:  AS NEEDED

## 2023-08-01 NOTE — Progress Notes (Signed)
Cardiology Office Note:    Date:  08/01/2023   ID:  Nichole Bryant 06-07-1946, MRN [id number]  PCP:  Donita Brooks, MD  [facility] Cardiologist:  Debbe Odea, MD  [facility] Electrophysiologist:  None   Referring MD: Donita Brooks, MD   Chief Complaint  Patient presents with   Follow-up    Patient concerned with new dyspnea on exertion.      History of Present Illness:    Nichole Bryant is a 78 y.o. female with a hx of hypertension, hyperlipidemia, varicose veins who presents for follow-up.  Previously seen due to hypertension, lower extremity swelling.  Has varicose veins, follows up with vascular clinic.  Compliant with her medications as prescribed.  Denies chest pain.  Denies shortness of breath.  States breathing heart sometimes when she walks.  Previous workup with echo and coronary CTA were unrevealing.  Prior notes Coronary CTA 04/2020 minimal mid LAD stenosis less than 25% Echocardiogram 03/2020 EF 55 to 60%  Past Medical History:  Diagnosis Date   Arthritis    Diabetes mellitus without complication (HCC)    no meds   High cholesterol    Hypertension    Varicose veins of left lower extremity     Past Surgical History:  Procedure Laterality Date   CATARACT EXTRACTION, BILATERAL     COLONOSCOPY     EYE SURGERY N/A    Phreesia 10/27/2020   TONSILLECTOMY     TUBAL LIGATION      Current Medications: Current Meds  Medication Sig   atenolol (TENORMIN) 25 MG tablet TAKE 1 TABLET (25 MG TOTAL) BY MOUTH DAILY.   Cholecalciferol (VITAMIN D-3 PO) Take by mouth.   fluticasone (FLONASE) 50 MCG/ACT nasal spray SPRAY 2 SPRAYS INTO EACH NOSTRIL EVERY DAY   hydrochlorothiazide (MICROZIDE) 12.5 MG capsule TAKE 1 CAPSULE BY MOUTH EVERY DAY   lovastatin (MEVACOR) 20 MG tablet TAKE 1 TABLET BY MOUTH EVERYDAY AT BEDTIME   meloxicam (MOBIC) 7.5 MG tablet Take 1 tablet (7.5 mg total) by mouth daily.   triamcinolone (KENALOG) 0.1 % paste  Use as directed 1 Application in the mouth or throat 2 (two) times daily.   vitamin B-12 (CYANOCOBALAMIN) 1000 MCG tablet Take 5,000 mcg by mouth daily.      Allergies:   Sulfa antibiotics   Social History   Socioeconomic History   Marital status: Married    Spouse name: Not on file   Number of children: Not on file   Years of education: Not on file   Highest education level: Not on file  Occupational History   Not on file  Tobacco Use   Smoking status: Never   Smokeless tobacco: Never  Vaping Use   Vaping status: Never Used  Substance and Sexual Activity   Alcohol use: Yes    Comment: wine occassionally   Drug use: No   Sexual activity: Not on file  Other Topics Concern   Not on file  Social History Narrative   Not on file   Social Drivers of Health   Financial Resource Strain: Low Risk  (11/11/2022)   Overall Financial Resource Strain (CARDIA)    Difficulty of Paying Living Expenses: Not hard at all  Food Insecurity: No Food Insecurity (11/11/2022)   Hunger Vital Sign    Worried About Running Out of Food in the Last Year: Never true    Ran Out of Food in the Last Year: Never true  Transportation Needs: No Transportation Needs (  11/11/2022)   PRAPARE - Administrator, Civil Service (Medical): No    Lack of Transportation (Non-Medical): No  Physical Activity: Sufficiently Active (11/11/2022)   Exercise Vital Sign    Days of Exercise per Week: 5 days    Minutes of Exercise per Session: 30 min  Stress: No Stress Concern Present (11/11/2022)   [employer] of Occupational Health - Occupational Stress Questionnaire    Feeling of Stress : Not at all  Social Connections: Socially Integrated (11/11/2022)   Social Connection and Isolation Panel [NHANES]    Frequency of Communication with Friends and Family: More than three times a week    Frequency of Social Gatherings with Friends and Family: More than three times a week    Attends Religious Services: More than 4  times per year    Active Member of [employer] or Organizations: Yes    Attends Engineer, structural: More than 4 times per year    Marital Status: Married     Family History: The patient's family history is negative for Colon cancer, Esophageal cancer, Rectal cancer, and Stomach cancer.  ROS:   Please see the history of present illness.     All other systems reviewed and are negative.  EKGs/Labs/Other Studies Reviewed:    The following studies were reviewed today:   EKG Interpretation Date/Time:  Monday August 01 2023 10:00:30 EST Ventricular Rate:  68 PR Interval:  210 QRS Duration:  78 QT Interval:  392 QTC Calculation: 416 R Axis:   19  Text Interpretation: Sinus rhythm with 1st degree A-V block with occasional Premature ventricular complexes and Premature atrial complexes Low voltage QRS Cannot rule out Anteroseptal infarct , age undetermined Confirmed by Debbe Odea ([id number]) on 08/01/2023 10:04:14 AM    Recent Labs: 06/27/2023: ALT 17; BUN 16; Creat 0.77; Hemoglobin 13.4; Platelets 254; Potassium 3.9; Sodium 139  Recent Lipid Panel    Component Value Date/Time   CHOL 159 03/29/2022 0816   TRIG 89 03/29/2022 0816   HDL 52 03/29/2022 0816   CHOLHDL 3.1 03/29/2022 0816   VLDL 17 02/05/2015 0937   LDLCALC 89 03/29/2022 0816    Physical Exam:    VS:  BP 118/60 (BP Location: Left Arm, Patient Position: Sitting, Cuff Size: Large)   Pulse 68   Ht 5\' 3"  (1.6 m)   Wt 165 lb 9.6 oz (75.1 kg)   SpO2 98%   BMI 29.33 kg/m     Wt Readings from Last 3 Encounters:  08/01/23 165 lb 9.6 oz (75.1 kg)  06/27/23 164 lb (74.4 kg)  11/12/22 165 lb 9.6 oz (75.1 kg)     GEN:  Well nourished, well developed in no acute distress HEENT: Normal NECK: No JVD; No carotid bruits CARDIAC: RRR, no murmurs, rubs, gallops RESPIRATORY:  Clear to auscultation without rales, wheezing or rhonchi  ABDOMEN: Soft, non-tender, non-distended MUSCULOSKELETAL: trace edema, varicose  veins noted SKIN: Warm and dry NEUROLOGIC:  Alert and oriented x 3 PSYCHIATRIC:  Normal affect   ASSESSMENT:    1. Primary hypertension   2. Pure hypercholesterolemia   3. Varicose veins of both legs with edema   4. Breathlessness    PLAN:    In order of problems listed above:  hypertension, BP controlled. continue HCTZ 12.5 mg daily, atenolol 25 mg daily. History of hyperlipidemia, continue lovastatin 20 mg daily. Leg edema secondary to varicose veins.  Continue compression stockings.  Managed by vascular surgery.   Increased breathlessness likely from  deconditioning.  Prior echo and coronary CT unrevealing.  Increased activity/exercise advised.  Follow-up as needed.   Medication Adjustments/Labs and Tests Ordered: Current medicines are reviewed at length with the patient today.  Concerns regarding medicines are outlined above.  Orders Placed This Encounter  Procedures   EKG 12-Lead   No orders of the defined types were placed in this encounter.   Patient Instructions  Medication Instructions:   Your physician recommends that you continue on your current medications as directed. Please refer to the Current Medication list given to you today.  *If you need a refill on your cardiac medications before your next appointment, please call your pharmacy*   Lab Work:  None Ordered  If you have labs (blood work) drawn today and your tests are completely normal, you will receive your results only by: MyChart Message (if you have MyChart) OR A paper copy in the mail If you have any lab test that is abnormal or we need to change your treatment, we will call you to review the results.   Testing/Procedures:  None Ordered    Follow-Up: At [facility], you and your health needs are our priority.  As part of our continuing mission to provide you with exceptional heart care, we have created designated Provider Care Teams.  These Care Teams include your primary  Cardiologist (physician) and Advanced Practice Providers (APPs -  Physician Assistants and Nurse Practitioners) who all work together to provide you with the care you need, when you need it.  We recommend signing up for the patient portal called "MyChart".  Sign up information is provided on this After Visit Summary.  MyChart is used to connect with patients for Virtual Visits (Telemedicine).  Patients are able to view lab/test results, encounter notes, upcoming appointments, etc.  Non-urgent messages can be sent to your provider as well.   To learn more about what you can do with MyChart, go to [email or web address].    Your next appointment:    AS NEEDED         Signed, Debbe Odea, MD  08/01/2023 11:05 AM    [facility]

## 2023-08-10 ENCOUNTER — Other Ambulatory Visit: Payer: Self-pay | Admitting: Family Medicine

## 2023-08-10 DIAGNOSIS — J029 Acute pharyngitis, unspecified: Secondary | ICD-10-CM

## 2023-08-17 ENCOUNTER — Other Ambulatory Visit: Payer: Self-pay | Admitting: Family Medicine

## 2023-08-17 MED ORDER — LOVASTATIN 20 MG PO TABS: ORAL_TABLET | ORAL | 1 refills | Status: DC

## 2023-08-22 ENCOUNTER — Other Ambulatory Visit: Payer: Self-pay | Admitting: Family Medicine

## 2023-08-22 MED ORDER — MELOXICAM 7.5 MG PO TABS: 7.5000 mg | ORAL_TABLET | Freq: Every day | ORAL | 0 refills | Status: DC

## 2023-08-22 NOTE — Telephone Encounter (Signed)
 Copied from CRM [id number]. Topic: Clinical - Medication Refill >> Aug 22, 2023 12:19 PM Shelah Lewandowsky wrote: Most Recent Primary Care Visit:  Provider: Lynnea Ferrier T  Department: [facility]  Visit Type: ACUTE  Date: 06/27/2023  Medication: atenolol (TENORMIN) 25 MG tablet meloxicam (MOBIC) 7.5 MG tablet  Has the patient contacted their pharmacy?  (Agent: If no, request that the patient contact the pharmacy for the refill. If patient does not wish to contact the pharmacy document the reason why and proceed with request.) (Agent: If yes, when and what did the pharmacy advise?)  Is this the correct pharmacy for this prescription? Yes If no, delete pharmacy and type the correct one.  This is the patient's preferred pharmacy:  [id number] [place], [place] - [address] [place] [address] Phone: [phone] Fax: [phone]   Has the prescription been filled recently? Yes  Is the patient out of the medication? Yes  Has the patient been seen for an appointment in the last year OR does the patient have an upcoming appointment? Yes  Can we respond through MyChart? Yes  Agent: Please be advised that Rx refills may take up to 3 business days. We ask that you follow-up with your pharmacy.

## 2023-08-22 NOTE — Telephone Encounter (Signed)
 Copied from CRM [id number]. Topic: Clinical - Medication Refill >> Aug 22, 2023 12:19 PM Shelah Lewandowsky wrote: Most Recent Primary Care Visit:  Provider: Lynnea Ferrier T  Department: [facility]  Visit Type: ACUTE  Date: 06/27/2023  Medication: atenolol (TENORMIN) 25 MG tablet meloxicam (MOBIC) 7.5 MG tablet  Has the patient contacted their pharmacy? Yes (Agent: If no, request that the patient contact the pharmacy for the refill. If patient does not wish to contact the pharmacy document the reason why and proceed with request.) (Agent: If yes, when and what did the pharmacy advise?)  Is this the correct pharmacy for this prescription? Yes If no, delete pharmacy and type the correct one.  This is the patient's preferred pharmacy:  [id number] [place], [place] - [address] [place] [address] Phone: [phone] Fax: [phone]   Has the prescription been filled recently? Yes  Is the patient out of the medication? Yes  Has the patient been seen for an appointment in the last year OR does the patient have an upcoming appointment? Yes  Can we respond through MyChart? Yes  Agent: Please be advised that Rx refills may take up to 3 business days. We ask that you follow-up with your pharmacy.

## 2023-09-03 ENCOUNTER — Other Ambulatory Visit: Payer: Self-pay | Admitting: Family Medicine

## 2023-09-05 ENCOUNTER — Other Ambulatory Visit: Payer: Self-pay

## 2023-09-05 DIAGNOSIS — I10 Essential (primary) hypertension: Secondary | ICD-10-CM

## 2023-09-05 MED ORDER — ATENOLOL 25 MG PO TABS: 25.0000 mg | ORAL_TABLET | Freq: Every day | ORAL | 0 refills | Status: DC

## 2023-09-14 ENCOUNTER — Other Ambulatory Visit: Payer: Self-pay | Admitting: Family Medicine

## 2023-09-14 NOTE — Telephone Encounter (Signed)
 Requested Prescriptions  Pending Prescriptions Disp Refills   lovastatin (MEVACOR) 20 MG tablet [Pharmacy Med Name: LOVASTATIN 20 MG TABLET] 90 tablet 0    Sig: TAKE 1 TABLET BY MOUTH EVERYDAY AT BEDTIME     Cardiovascular:  Antilipid - Statins 2 Failed - 09/14/2023  3:56 PM      Failed - Valid encounter within last 12 months    Recent Outpatient Visits           1 year ago Rib pain   [facility] Donita Brooks, MD   2 years ago Sore throat   [facility] Valentino Nose, NP   2 years ago Essential hypertension   [facility] Tanya Nones, Priscille Heidelberg, MD   3 years ago Dyspnea on exertion   [facility] Tanya Nones, Priscille Heidelberg, MD   3 years ago Routine general medical examination at a health care facility   [facility] Pickard, Priscille Heidelberg, MD              Failed - Lipid Panel in normal range within the last 12 months    Cholesterol  Date Value Ref Range Status  03/29/2022 159 <200 mg/dL Final   LDL Cholesterol (Calc)  Date Value Ref Range Status  03/29/2022 89 mg/dL (calc) Final    Comment:    Reference range: <100 . Desirable range <100 mg/dL for primary prevention;   <70 mg/dL for patients with CHD or diabetic patients  with > or = 2 CHD risk factors. Marland Kitchen LDL-C is now calculated using the Martin-Hopkins  calculation, which is a validated novel method providing  better accuracy than the Friedewald equation in the  estimation of LDL-C.  Horald Pollen et al. Lenox Ahr. 0454;098(11): 2061-2068  (http://education.Quest[employer].com/faq/FAQ164)    HDL  Date Value Ref Range Status  03/29/2022 52 > OR = 50 mg/dL Final   Triglycerides  Date Value Ref Range Status  03/29/2022 89 <150 mg/dL Final         Passed - Cr in normal range and within 360 days    Creat  Date Value Ref Range Status  06/27/2023 0.77 0.60 - 1.00 mg/dL Final   Creatinine, Urine  Date Value Ref Range Status  12/10/2020 66 20 -  275 mg/dL Final         Passed - Patient is not pregnant

## 2023-09-21 DIAGNOSIS — H04123 Dry eye syndrome of bilateral lacrimal glands: Secondary | ICD-10-CM | POA: Diagnosis not present

## 2023-09-21 DIAGNOSIS — H52203 Unspecified astigmatism, bilateral: Secondary | ICD-10-CM | POA: Diagnosis not present

## 2023-09-21 DIAGNOSIS — Z961 Presence of intraocular lens: Secondary | ICD-10-CM | POA: Diagnosis not present

## 2023-11-14 ENCOUNTER — Ambulatory Visit (INDEPENDENT_AMBULATORY_CARE_PROVIDER_SITE_OTHER): Payer: Medicare PPO | Admitting: Family Medicine

## 2023-11-14 ENCOUNTER — Encounter: Payer: Self-pay | Admitting: Family Medicine

## 2023-11-14 VITALS — BP 136/74 | HR 62 | Temp 98.4°F | Ht 63.0 in | Wt 162.8 lb

## 2023-11-14 DIAGNOSIS — E7800 Pure hypercholesterolemia, unspecified: Secondary | ICD-10-CM

## 2023-11-14 DIAGNOSIS — Z780 Asymptomatic menopausal state: Secondary | ICD-10-CM

## 2023-11-14 DIAGNOSIS — M25551 Pain in right hip: Secondary | ICD-10-CM | POA: Diagnosis not present

## 2023-11-14 DIAGNOSIS — R0781 Pleurodynia: Secondary | ICD-10-CM | POA: Diagnosis not present

## 2023-11-14 DIAGNOSIS — R7303 Prediabetes: Secondary | ICD-10-CM | POA: Diagnosis not present

## 2023-11-14 DIAGNOSIS — I10 Essential (primary) hypertension: Secondary | ICD-10-CM | POA: Diagnosis not present

## 2023-11-14 DIAGNOSIS — Z0000 Encounter for general adult medical examination without abnormal findings: Secondary | ICD-10-CM

## 2023-11-14 DIAGNOSIS — Z0001 Encounter for general adult medical examination with abnormal findings: Secondary | ICD-10-CM

## 2023-11-14 MED ORDER — ATENOLOL 25 MG PO TABS: 25.0000 mg | ORAL_TABLET | Freq: Every day | ORAL | 3 refills | Status: AC

## 2023-11-14 MED ORDER — HYDROCHLOROTHIAZIDE 12.5 MG PO CAPS: ORAL_CAPSULE | ORAL | 3 refills | Status: AC

## 2023-11-14 MED ORDER — LOVASTATIN 20 MG PO TABS: ORAL_TABLET | ORAL | 3 refills | Status: AC

## 2023-11-14 MED ORDER — MELOXICAM 7.5 MG PO TABS: 7.5000 mg | ORAL_TABLET | Freq: Every day | ORAL | 3 refills | Status: AC

## 2023-11-14 NOTE — Progress Notes (Signed)
 Subjective:    Patient ID: Nichole Bryant, female    DOB: [birth year], 78 y.o.   MRN: [id number]  HPI  Patient is a 78 year old Caucasian female here today for physical exam.  She has significant kyphosis in the cervical spine.  This essentially causes her head to be in a constant forward flexed position.  She also has low back pain related to her posture.  She feels unstable walking due to her "hunched-over posture".  She would be interested in a back brace if it would help her stand up straighter.  I believe that a T LSO brace would help the patient stand up straighter.  I believe this would help her balance because right now I feel her center of gravity is in front of her legs causing her to topple forward when she walks.  Regarding immunizations she is due for the RSV vaccine.  Regarding cancer screening, she is overdue for mammogram.  Her last colonoscopy was in 2021.  She does not require another.  She does not require Pap smear.  Her last bone density test was 5 years ago and is due to be repeated  Immunization History  Administered Date(s) Administered   Fluad Quad(high Dose 78+) 04/24/2020, 03/28/2021, 04/13/2022   Influenza Split 04/25/2013   Influenza, High Dose Seasonal PF 05/03/2018, 03/31/2019   Influenza,inj,Quad PF,6+ Mos 04/22/2015, 04/06/2016   Influenza-Unspecified 05/09/2017, 03/28/2019   PFIZER Comirnaty(Gray Top)Covid-19 Tri-Sucrose Vaccine 07/29/2020   PFIZER(Purple Top)SARS-COV-2 Vaccination 09/29/2019, 10/24/2019   Pfizer Covid-19 Vaccine Bivalent Booster 64yrs & up 07/22/2021   Pfizer(Comirnaty)Fall Seasonal Vaccine 12 years and older 03/18/2023   Pneumococcal Conjugate-13 09/22/2015   Pneumococcal Polysaccharide-23 07/07/2012   Td 11/01/2000   Tdap 07/18/2013   Zoster Recombinant(Shingrix) 09/21/2017, 12/30/2017   Zoster, Live 02/11/2014    Past Medical History:  Diagnosis Date   Arthritis    Diabetes mellitus without complication (HCC)    no meds    High cholesterol    Hypertension    Varicose veins of left lower extremity    Past Surgical History:  Procedure Laterality Date   CATARACT EXTRACTION, BILATERAL     COLONOSCOPY     EYE SURGERY N/A    Phreesia 10/27/2020   TONSILLECTOMY     TUBAL LIGATION     Current Outpatient Medications on File Prior to Visit  Medication Sig Dispense Refill   atenolol  (TENORMIN ) 25 MG tablet Take 1 tablet (25 mg total) by mouth daily. 90 tablet 0   Cholecalciferol (VITAMIN D-3 PO) Take by mouth.     fluticasone  (FLONASE ) 50 MCG/ACT nasal spray SPRAY 2 SPRAYS INTO EACH NOSTRIL EVERY DAY 48 mL 0   hydrochlorothiazide  (MICROZIDE ) 12.5 MG capsule TAKE 1 CAPSULE BY MOUTH EVERY DAY 30 capsule 0   lovastatin  (MEVACOR ) 20 MG tablet TAKE 1 TABLET BY MOUTH EVERYDAY AT BEDTIME 90 tablet 0   meloxicam  (MOBIC ) 7.5 MG tablet Take 1 tablet (7.5 mg total) by mouth daily. 90 tablet 0   triamcinolone  (KENALOG ) 0.1 % paste Use as directed 1 Application in the mouth or throat 2 (two) times daily. 5 g 0   vitamin B-12 (CYANOCOBALAMIN) 1000 MCG tablet Take 5,000 mcg by mouth daily.      No current facility-administered medications on file prior to visit.   Allergies  Allergen Reactions   Sulfa Antibiotics     Hives, made ears swell   Social History   Socioeconomic History   Marital status: Married    Spouse name: Not on file  Number of children: Not on file   Years of education: Not on file   Highest education level: Not on file  Occupational History   Not on file  Tobacco Use   Smoking status: Never   Smokeless tobacco: Never  Vaping Use   Vaping status: Never Used  Substance and Sexual Activity   Alcohol use: Yes    Comment: wine occassionally   Drug use: No   Sexual activity: Not on file  Other Topics Concern   Not on file  Social History Narrative   Not on file   Social Drivers of Health   Financial Resource Strain: Low Risk  (11/11/2022)   Overall Financial Resource Strain (CARDIA)     Difficulty of Paying Living Expenses: Not hard at all  Food Insecurity: No Food Insecurity (11/11/2022)   Hunger Vital Sign    Worried About Running Out of Food in the Last Year: Never true    Ran Out of Food in the Last Year: Never true  Transportation Needs: No Transportation Needs (11/11/2022)   PRAPARE - Administrator, Civil Service (Medical): No    Lack of Transportation (Non-Medical): No  Physical Activity: Sufficiently Active (11/11/2022)   Exercise Vital Sign    Days of Exercise per Week: 5 days    Minutes of Exercise per Session: 30 min  Stress: No Stress Concern Present (11/11/2022)   [employer] of Occupational Health - Occupational Stress Questionnaire    Feeling of Stress : Not at all  Social Connections: Socially Integrated (11/11/2022)   Social Connection and Isolation Panel [NHANES]    Frequency of Communication with Friends and Family: More than three times a week    Frequency of Social Gatherings with Friends and Family: More than three times a week    Attends Religious Services: More than 4 times per year    Active Member of [employer] or Organizations: Yes    Attends Engineer, structural: More than 4 times per year    Marital Status: Married  Catering manager Violence: Not At Risk (11/11/2022)   Humiliation, Afraid, Rape, and Kick questionnaire    Fear of Current or Ex-Partner: No    Emotionally Abused: No    Physically Abused: No    Sexually Abused: No   Family History  Problem Relation Age of Onset   Colon cancer Neg Hx    Esophageal cancer Neg Hx    Rectal cancer Neg Hx    Stomach cancer Neg Hx       Review of Systems  All other systems reviewed and are negative.      Objective:   Physical Exam Vitals reviewed.  Constitutional:      General: She is not in acute distress.    Appearance: She is well-developed. She is not diaphoretic.  HENT:     Head: Normocephalic and atraumatic.     Right Ear: External ear normal.     Left Ear:  External ear normal.     Nose: Nose normal.     Mouth/Throat:     Pharynx: No oropharyngeal exudate.  Eyes:     General: No scleral icterus.       Right eye: No discharge.        Left eye: No discharge.     Conjunctiva/sclera: Conjunctivae normal.     Pupils: Pupils are equal, round, and reactive to light.  Neck:     Thyroid: No thyromegaly.     Vascular: No JVD.  Cardiovascular:     Rate and Rhythm: Normal rate and regular rhythm.     Heart sounds: Normal heart sounds. No murmur heard.    No friction rub. No gallop.  Pulmonary:     Effort: Pulmonary effort is normal. No respiratory distress.     Breath sounds: Normal breath sounds. No stridor. No wheezing or rales.  Chest:     Chest wall: No tenderness.  Abdominal:     General: Bowel sounds are normal. There is no distension.     Palpations: Abdomen is soft. There is no mass.     Tenderness: There is no abdominal tenderness. There is no guarding or rebound.  Musculoskeletal:        General: No tenderness.     Cervical back: Normal range of motion and neck supple. Deformity present.  Lymphadenopathy:     Cervical: No cervical adenopathy.  Skin:    General: Skin is warm.     Coloration: Skin is not pale.     Findings: No erythema or rash.  Neurological:     Mental Status: She is alert and oriented to person, place, and time.     Cranial Nerves: No cranial nerve deficit.     Motor: No abnormal muscle tone.     Coordination: Coordination normal.     Deep Tendon Reflexes: Reflexes are normal and symmetric.  Psychiatric:        Behavior: Behavior normal.        Thought Content: Thought content normal.        Judgment: Judgment normal.     Assessment & Plan:  Pure hypercholesterolemia - Plan: lovastatin  (MEVACOR ) 20 MG tablet  Primary hypertension - Plan: atenolol  (TENORMIN ) 25 MG tablet, hydrochlorothiazide  (MICROZIDE ) 12.5 MG capsule  Rib pain - Plan: meloxicam  (MOBIC ) 7.5 MG tablet  Right hip pain - Plan: meloxicam   (MOBIC ) 7.5 MG tablet  Postmenopausal estrogen deficiency - Plan: DG Bone Density  Prediabetes - Plan: CBC with Differential/Platelet, Comprehensive metabolic panel with GFR, Lipid panel, Hemoglobin A1c  General medical exam Her blood pressure is acceptable today.  I will make no changes in her antihypertensives.  I will schedule the patient for a bone density test to screen for osteoporosis.  Given her history of prediabetes I will check a CBC a CMP lipid panel and an A1c.  Goal A1c is less than 6.5.  Goal LDL cholesterol is less than 100.  I refilled her meloxicam  which she takes for her hip pain joint pains and arthritic pains in her hands.  Recommended the RSV vaccine.  Recommended she schedule her mammogram.  She prefers to do this herself.

## 2023-11-15 ENCOUNTER — Ambulatory Visit: Payer: Self-pay

## 2023-11-15 LAB — CBC WITH DIFFERENTIAL/PLATELET
Absolute Lymphocytes: 1638 {cells}/uL (ref 850–3900)
Absolute Monocytes: 559 {cells}/uL (ref 200–950)
Basophils Absolute: 20 {cells}/uL (ref 0–200)
Basophils Relative: 0.3 %
Eosinophils Absolute: 78 {cells}/uL (ref 15–500)
Eosinophils Relative: 1.2 %
HCT: 39.1 % (ref 35.0–45.0)
Hemoglobin: 12.8 g/dL (ref 11.7–15.5)
MCH: 28.8 pg (ref 27.0–33.0)
MCHC: 32.7 g/dL (ref 32.0–36.0)
MCV: 87.9 fL (ref 80.0–100.0)
MPV: 10.9 fL (ref 7.5–12.5)
Monocytes Relative: 8.6 %
Neutro Abs: 4206 {cells}/uL (ref 1500–7800)
Neutrophils Relative %: 64.7 %
Platelets: 179 10*3/uL (ref 140–400)
RBC: 4.45 10*6/uL (ref 3.80–5.10)
RDW: 12.3 % (ref 11.0–15.0)
Total Lymphocyte: 25.2 %
WBC: 6.5 10*3/uL (ref 3.8–10.8)

## 2023-11-15 LAB — LIPID PANEL
Cholesterol: 173 mg/dL (ref <200)
HDL: 66 mg/dL (ref >=50)
LDL Cholesterol (Calc): 91 mg/dL
Non-HDL Cholesterol (Calc): 107 mg/dL (ref <130)
Total CHOL/HDL Ratio: 2.6 (calc) (ref <5.0)
Triglycerides: 72 mg/dL (ref <150)

## 2023-11-15 LAB — COMPREHENSIVE METABOLIC PANEL WITH GFR
AG Ratio: 2.2 (calc) (ref 1.0–2.5)
ALT: 17 U/L (ref 6–29)
AST: 23 U/L (ref 10–35)
Albumin: 4.3 g/dL (ref 3.6–5.1)
Alkaline phosphatase (APISO): 59 U/L (ref 37–153)
BUN: 18 mg/dL (ref 7–25)
CO2: 31 mmol/L (ref 20–32)
Calcium: 9.2 mg/dL (ref 8.6–10.4)
Chloride: 102 mmol/L (ref 98–110)
Creat: 0.86 mg/dL (ref 0.60–1.00)
Globulin: 2 g/dL (ref 1.9–3.7)
Glucose, Bld: 118 mg/dL — ABNORMAL HIGH (ref 65–99)
Potassium: 4.5 mmol/L (ref 3.5–5.3)
Sodium: 139 mmol/L (ref 135–146)
Total Bilirubin: 0.8 mg/dL (ref 0.2–1.2)
Total Protein: 6.3 g/dL (ref 6.1–8.1)
eGFR: 69 mL/min/{1.73_m2} (ref >=60)

## 2023-11-15 LAB — HEMOGLOBIN A1C
Hgb A1c MFr Bld: 6.3 % — ABNORMAL HIGH (ref <5.7)
Mean Plasma Glucose: 134 mg/dL
eAG (mmol/L): 7.4 mmol/L

## 2023-11-22 ENCOUNTER — Encounter (INDEPENDENT_AMBULATORY_CARE_PROVIDER_SITE_OTHER): Payer: Self-pay

## 2023-12-15 ENCOUNTER — Ambulatory Visit: Payer: Medicare PPO | Admitting: *Deleted

## 2023-12-15 DIAGNOSIS — Z0000 Encounter for general adult medical examination without abnormal findings: Secondary | ICD-10-CM | POA: Diagnosis not present

## 2023-12-15 NOTE — Progress Notes (Signed)
 Subjective:   Nichole Bryant is a 78 y.o. female who presents for Medicare Annual (Subsequent) preventive examination.  Visit Complete: Virtual I connected with  Nichole Bryant on 12/15/23 by a audio enabled telemedicine application and verified that I am speaking with the correct person using two identifiers.  Patient Location: Home  Provider Location: Home Office  I discussed the limitations of evaluation and management by telemedicine. The patient expressed understanding and agreed to proceed.  Vital Signs: Because this visit was a virtual/telehealth visit, some criteria may be missing or patient reported. Any vitals not documented were not able to be obtained and vitals that have been documented are patient reported.  Patient Medicare AWV questionnaire was completed by the patient on 12-15-2023; I have confirmed that all information answered by patient is correct and no changes since this date.  Cardiac Risk Factors include: advanced age (>66men, >68 women)     Objective:    There were no vitals filed for this visit. There is no height or weight on file to calculate BMI.     12/15/2023    8:27 AM 11/11/2022    9:21 AM 10/30/2021    2:43 PM 10/27/2020    3:00 PM  Advanced Directives  Does Patient Have a Medical Advance Directive? No No No No  Would patient like information on creating a medical advance directive? No - Patient declined Yes (MAU/Ambulatory/Procedural Areas - Information given) No - Patient declined No - Patient declined    Current Medications (verified) Outpatient Encounter Medications as of 12/15/2023  Medication Sig   atenolol  (TENORMIN ) 25 MG tablet Take 1 tablet (25 mg total) by mouth daily.   Cholecalciferol (VITAMIN D-3 PO) Take by mouth.   fluticasone  (FLONASE ) 50 MCG/ACT nasal spray SPRAY 2 SPRAYS INTO EACH NOSTRIL EVERY DAY   hydrochlorothiazide  (MICROZIDE ) 12.5 MG capsule TAKE 1 CAPSULE BY MOUTH EVERY DAY   lovastatin  (MEVACOR ) 20 MG  tablet TAKE 1 TABLET BY MOUTH EVERYDAY AT BEDTIME   meloxicam  (MOBIC ) 7.5 MG tablet Take 1 tablet (7.5 mg total) by mouth daily.   triamcinolone  (KENALOG ) 0.1 % paste Use as directed 1 Application in the mouth or throat 2 (two) times daily.   vitamin B-12 (CYANOCOBALAMIN) 1000 MCG tablet Take 5,000 mcg by mouth daily.    No facility-administered encounter medications on file as of 12/15/2023.    Allergies (verified) Sulfa antibiotics   History: Past Medical History:  Diagnosis Date   Arthritis    Diabetes mellitus without complication (HCC)    no meds   High cholesterol    Hypertension    Varicose veins of left lower extremity    Past Surgical History:  Procedure Laterality Date   CATARACT EXTRACTION, BILATERAL     COLONOSCOPY     EYE SURGERY N/A    [employer] 10/27/2020   TONSILLECTOMY     TUBAL LIGATION     Family History  Problem Relation Age of Onset   Colon cancer Neg Hx    Esophageal cancer Neg Hx    Rectal cancer Neg Hx    Stomach cancer Neg Hx    Social History   Socioeconomic History   Marital status: Married    Spouse name: Not on file   Number of children: Not on file   Years of education: Not on file   Highest education level: Some college, no degree  Occupational History   Not on file  Tobacco Use   Smoking status: Never   Smokeless tobacco:  Never  Vaping Use   Vaping status: Never Used  Substance and Sexual Activity   Alcohol use: Yes    Comment: wine occassionally   Drug use: No   Sexual activity: Not Currently  Other Topics Concern   Not on file  Social History Narrative   Not on file   Social Drivers of Health   Financial Resource Strain: Low Risk  (12/15/2023)   Overall Financial Resource Strain (CARDIA)    Difficulty of Paying Living Expenses: Not hard at all  Food Insecurity: No Food Insecurity (12/15/2023)   Hunger Vital Sign    Worried About Running Out of Food in the Last Year: Never true    Ran Out of Food in the Last Year:  Never true  Transportation Needs: No Transportation Needs (12/15/2023)   PRAPARE - Administrator, Civil Service (Medical): No    Lack of Transportation (Non-Medical): No  Physical Activity: Insufficiently Active (12/15/2023)   Exercise Vital Sign    Days of Exercise per Week: 1 day    Minutes of Exercise per Session: 10 min  Stress: Stress Concern Present (12/15/2023)   [employer] of Occupational Health - Occupational Stress Questionnaire    Feeling of Stress: To some extent  Social Connections: Socially Integrated (12/15/2023)   Social Connection and Isolation Panel    Frequency of Communication with Friends and Family: Twice a week    Frequency of Social Gatherings with Friends and Family: Twice a week    Attends Religious Services: 1 to 4 times per year    Active Member of [employer] or Organizations: Yes    Attends Banker Meetings: 1 to 4 times per year    Marital Status: Married    Tobacco Counseling Counseling given: Not Answered   Clinical Intake:  Pre-visit preparation completed: Yes  Pain : No/denies pain     Diabetes: No  How often do you need to have someone help you when you read instructions, pamphlets, or other written materials from your doctor or pharmacy?: 1 - Never  Interpreter Needed?: No  Information entered by :: Kieth Pelt LPN   Activities of Daily Living    12/15/2023    8:39 AM 12/15/2023    7:54 AM  In your present state of health, do you have any difficulty performing the following activities:  Hearing? 0 0  Vision? 0 0  Difficulty concentrating or making decisions? 0 0  Walking or climbing stairs? 0 0  Dressing or bathing? 0 0  Doing errands, shopping? 0 0  Preparing Food and eating ? N N  Using the Toilet? N N  In the past six months, have you accidently leaked urine? Y Y  Do you have problems with loss of bowel control? N N  Managing your Medications? N N  Managing your Finances? N N  Housekeeping or  managing your Housekeeping? N N    Patient Care Team: Austine Lefort, MD as PCP - General (Family Medicine) Constancia Delton, MD as PCP - Cardiology (Cardiology)  Indicate any recent Medical Services you may have received from other than [facility] providers in the past year (date may be approximate).     Assessment:   This is a routine wellness examination for [place].  Hearing/Vision screen Hearing Screening - Comments:: No trouble hearing Vision Screening - Comments:: [facility] Up to date    Goals Addressed             This Visit's Progress  Weight (lb) < 200 lb (90.7 kg)         Depression Screen    12/15/2023    8:23 AM 11/14/2023    8:13 AM 06/27/2023    9:09 AM 11/11/2022    8:59 AM 06/02/2022    3:45 PM 10/30/2021    2:41 PM 10/27/2020    3:05 PM  PHQ 2/9 Scores  PHQ - 2 Score 2 0 2 0 0 0 0  PHQ- 9 Score 6  5  3   0    Fall Risk    12/15/2023    8:20 AM 12/15/2023    7:54 AM 11/14/2023    8:13 AM 06/27/2023    9:09 AM 11/11/2022    9:21 AM  Fall Risk   Falls in the past year? 0 0 0 0 0  Number falls in past yr: 0 0 0 0 0  Injury with Fall? 0  0 0 0  Risk for fall due to :   No Fall Risks  No Fall Risks  Follow up Falls evaluation completed;Education provided;Falls prevention discussed  Falls prevention discussed;Falls evaluation completed  Falls prevention discussed;Education provided;Falls evaluation completed    MEDICARE RISK AT HOME: Medicare Risk at Home Any stairs in or around the home?: Yes If so, are there any without handrails?: Yes Home free of loose throw rugs in walkways, pet beds, electrical cords, etc?: No Adequate lighting in your home to reduce risk of falls?: Yes Life alert?: No Use of a cane, walker or w/c?: No Grab bars in the bathroom?: No Shower chair or bench in shower?: No Elevated toilet seat or a handicapped toilet?: No  TIMED UP AND GO:  Was the test performed?  No    Cognitive Function:        12/15/2023    8:21 AM  11/11/2022    9:21 AM 10/30/2021    2:49 PM  6CIT Screen  What Year? 0 points 0 points 0 points  What month? 0 points 0 points 0 points  What time? 0 points 0 points 0 points  Count back from 20 0 points 0 points 0 points  Months in reverse 0 points 0 points 0 points  Repeat phrase 2 points 0 points 0 points  Total Score 2 points 0 points 0 points    Immunizations Immunization History  Administered Date(s) Administered   Fluad Quad(high Dose 65+) 04/24/2020, 03/28/2021, 04/13/2022   Influenza Split 04/25/2013   Influenza, High Dose Seasonal PF 05/03/2018, 03/31/2019, 03/18/2023   Influenza,inj,Quad PF,6+ Mos 04/22/2015, 04/06/2016   Influenza-Unspecified 05/09/2017, 03/28/2019   PFIZER Comirnaty(Gray Top)Covid-19 Tri-Sucrose Vaccine 07/29/2020   PFIZER(Purple Top)SARS-COV-2 Vaccination 09/29/2019, 10/24/2019   Pfizer Covid-19 Vaccine Bivalent Booster 2yrs & up 07/22/2021   Pfizer(Comirnaty)Fall Seasonal Vaccine 12 years and older 03/18/2023   Pneumococcal Conjugate-13 09/22/2015   Pneumococcal Polysaccharide-23 07/07/2012   Td 11/01/2000   Tdap 07/18/2013   Zoster Recombinant(Shingrix) 09/21/2017, 12/30/2017   Zoster, Live 02/11/2014    TDAP status: Due, Education has been provided regarding the importance of this vaccine. Advised may receive this vaccine at local pharmacy or Health Dept. Aware to provide a copy of the vaccination record if obtained from local pharmacy or Health Dept. Verbalized acceptance and understanding.  Flu Vaccine status: Up to date  Pneumococcal vaccine status: Up to date  Covid-19 vaccine status: Information provided on how to obtain vaccines.   Qualifies for Shingles Vaccine? No   Zostavax completed Yes   Shingrix Completed?: Yes  Screening Tests  Health Maintenance  Topic Date Due   MAMMOGRAM  07/25/2020   COVID-19 Vaccine (6 - 2024-25 season) 12/31/2023 (Originally 09/15/2023)   DTaP/Tdap/Td (3 - Td or Tdap) 11/13/2024 (Originally 07/19/2023)    INFLUENZA VACCINE  02/03/2024   Medicare Annual Wellness (AWV)  12/14/2024   Pneumococcal Vaccine: 50+ Years  Completed   DEXA SCAN  Completed   Hepatitis C Screening  Completed   Zoster Vaccines- Shingrix  Completed   HPV VACCINES  Aged Out   Meningococcal B Vaccine  Aged Out   Colonoscopy  Discontinued    Health Maintenance  Health Maintenance Due  Topic Date Due   MAMMOGRAM  07/25/2020    Colorectal cancer screening: No longer required.   Mammogram Education provided  Bone Density status: Ordered  . Pt provided with contact info and advised to call to schedule appt.  Lung Cancer Screening: (Low Dose CT Chest recommended if Age 93-80 years, 20 pack-year currently smoking OR have quit w/in 15years.) does not qualify.   Lung Cancer Screening Referral:   Additional Screening:  Hepatitis C Screening: does not qualify; Completed 2022  Vision Screening: Recommended annual ophthalmology exams for early detection of glaucoma and other disorders of the eye. Is the patient up to date with their annual eye exam?  Yes  Who is the provider or what is the name of the office in which the patient attends annual eye exams? [facility] If pt is not established with a provider, would they like to be referred to a provider to establish care? No .   Dental Screening: Recommended annual dental exams for proper oral hygiene    Community Resource Referral / Chronic Care Management: CRR required this visit?  No   CCM required this visit?  No     Plan:     I have personally reviewed and noted the following in the patient's chart:   Medical and social history Use of alcohol, tobacco or illicit drugs  Current medications and supplements including opioid prescriptions. Patient is not currently taking opioid prescriptions. Functional ability and status Nutritional status Physical activity Advanced directives List of other physicians Hospitalizations, surgeries, and ER visits in previous  12 months Vitals Screenings to include cognitive, depression, and falls Referrals and appointments  In addition, I have reviewed and discussed with patient certain preventive protocols, quality metrics, and best practice recommendations. A written personalized care plan for preventive services as well as general preventive health recommendations were provided to patient.     Kieth Pelt, LPN   1/61/0960   After Visit Summary: (MyChart) Due to this being a telephonic visit, the after visit summary with patients personalized plan was offered to patient via MyChart   Nurse Notes:

## 2023-12-15 NOTE — Patient Instructions (Signed)
 Nichole Bryant , Thank you for taking time to come for your Medicare Wellness Visit. I appreciate your ongoing commitment to your health goals. Please review the following plan we discussed and let me know if I can assist you in the future.   Screening recommendations/referrals: Colonoscopy: no longer required Mammogram: Education provided Bone Density: Education provided Recommended yearly ophthalmology/optometry visit for glaucoma screening and checkup Recommended yearly dental visit for hygiene and checkup  Vaccinations: Influenza vaccine: up to date Pneumococcal vaccine: up to date Tdap vaccine: Education provided Shingles vaccine: up to date    Advanced directives: Education provided    Preventive Care 65 Years and Older, Female Preventive care refers to lifestyle choices and visits with your health care provider that can promote health and wellness. What does preventive care include? A yearly physical exam. This is also called an annual well check. Dental exams once or twice a year. Routine eye exams. Ask your health care provider how often you should have your eyes checked. Personal lifestyle choices, including: Daily care of your teeth and gums. Regular physical activity. Eating a healthy diet. Avoiding tobacco and drug use. Limiting alcohol use. Practicing safe sex. Taking low-dose aspirin every day. Taking vitamin and mineral supplements as recommended by your health care provider. What happens during an annual well check? The services and screenings done by your health care provider during your annual well check will depend on your age, overall health, lifestyle risk factors, and family history of disease. Counseling  Your health care provider may ask you questions about your: Alcohol use. Tobacco use. Drug use. Emotional well-being. Home and relationship well-being. Sexual activity. Eating habits. History of falls. Memory and ability to understand  (cognition). Work and work Astronomer. Reproductive health. Screening  You may have the following tests or measurements: Height, weight, and BMI. Blood pressure. Lipid and cholesterol levels. These may be checked every 5 years, or more frequently if you are over 28 years old. Skin check. Lung cancer screening. You may have this screening every year starting at age 2 if you have a 30-pack-year history of smoking and currently smoke or have quit within the past 15 years. Fecal occult blood test (FOBT) of the stool. You may have this test every year starting at age 25. Flexible sigmoidoscopy or colonoscopy. You may have a sigmoidoscopy every 5 years or a colonoscopy every 10 years starting at age 14. Hepatitis C blood test. Hepatitis B blood test. Sexually transmitted disease (STD) testing. Diabetes screening. This is done by checking your blood sugar (glucose) after you have not eaten for a while (fasting). You may have this done every 1-3 years. Bone density scan. This is done to screen for osteoporosis. You may have this done starting at age 61. Mammogram. This may be done every 1-2 years. Talk to your health care provider about how often you should have regular mammograms. Talk with your health care provider about your test results, treatment options, and if necessary, the need for more tests. Vaccines  Your health care provider may recommend certain vaccines, such as: Influenza vaccine. This is recommended every year. Tetanus, diphtheria, and acellular pertussis (Tdap, Td) vaccine. You may need a Td booster every 10 years. Zoster vaccine. You may need this after age 1. Pneumococcal 13-valent conjugate (PCV13) vaccine. One dose is recommended after age 72. Pneumococcal polysaccharide (PPSV23) vaccine. One dose is recommended after age 73. Talk to your health care provider about which screenings and vaccines you need and how often you need  them. This information is not intended to  replace advice given to you by your health care provider. Make sure you discuss any questions you have with your health care provider. Document Released: 07/18/2015 Document Revised: 03/10/2016 Document Reviewed: 04/22/2015 Elsevier Interactive Patient Education  2017 [employer].  Fall Prevention in the Home Falls can cause injuries. They can happen to people of all ages. There are many things you can do to make your home safe and to help prevent falls. What can I do on the outside of my home? Regularly fix the edges of walkways and driveways and fix any cracks. Remove anything that might make you trip as you walk through a door, such as a raised step or threshold. Trim any bushes or trees on the path to your home. Use bright outdoor lighting. Clear any walking paths of anything that might make someone trip, such as rocks or tools. Regularly check to see if handrails are loose or broken. Make sure that both sides of any steps have handrails. Any raised decks and porches should have guardrails on the edges. Have any leaves, snow, or ice cleared regularly. Use sand or salt on walking paths during winter. Clean up any spills in your garage right away. This includes oil or grease spills. What can I do in the bathroom? Use night lights. Install grab bars by the toilet and in the tub and shower. Do not use towel bars as grab bars. Use non-skid mats or decals in the tub or shower. If you need to sit down in the shower, use a plastic, non-slip stool. Keep the floor dry. Clean up any water that spills on the floor as soon as it happens. Remove soap buildup in the tub or shower regularly. Attach bath mats securely with double-sided non-slip rug tape. Do not have throw rugs and other things on the floor that can make you trip. What can I do in the bedroom? Use night lights. Make sure that you have a light by your bed that is easy to reach. Do not use any sheets or blankets that are too big for  your bed. They should not hang down onto the floor. Have a firm chair that has side arms. You can use this for support while you get dressed. Do not have throw rugs and other things on the floor that can make you trip. What can I do in the kitchen? Clean up any spills right away. Avoid walking on wet floors. Keep items that you use a lot in easy-to-reach places. If you need to reach something above you, use a strong step stool that has a grab bar. Keep electrical cords out of the way. Do not use floor polish or wax that makes floors slippery. If you must use wax, use non-skid floor wax. Do not have throw rugs and other things on the floor that can make you trip. What can I do with my stairs? Do not leave any items on the stairs. Make sure that there are handrails on both sides of the stairs and use them. Fix handrails that are broken or loose. Make sure that handrails are as long as the stairways. Check any carpeting to make sure that it is firmly attached to the stairs. Fix any carpet that is loose or worn. Avoid having throw rugs at the top or bottom of the stairs. If you do have throw rugs, attach them to the floor with carpet tape. Make sure that you have a light switch at  the top of the stairs and the bottom of the stairs. If you do not have them, ask someone to add them for you. What else can I do to help prevent falls? Wear shoes that: Do not have high heels. Have rubber bottoms. Are comfortable and fit you well. Are closed at the toe. Do not wear sandals. If you use a stepladder: Make sure that it is fully opened. Do not climb a closed stepladder. Make sure that both sides of the stepladder are locked into place. Ask someone to hold it for you, if possible. Clearly mark and make sure that you can see: Any grab bars or handrails. First and last steps. Where the edge of each step is. Use tools that help you move around (mobility aids) if they are needed. These  include: Canes. Walkers. Scooters. Crutches. Turn on the lights when you go into a dark area. Replace any light bulbs as soon as they burn out. Set up your furniture so you have a clear path. Avoid moving your furniture around. If any of your floors are uneven, fix them. If there are any pets around you, be aware of where they are. Review your medicines with your doctor. Some medicines can make you feel dizzy. This can increase your chance of falling. Ask your doctor what other things that you can do to help prevent falls. This information is not intended to replace advice given to you by your health care provider. Make sure you discuss any questions you have with your health care provider. Document Released: 04/17/2009 Document Revised: 11/27/2015 Document Reviewed: 07/26/2014 Elsevier Interactive Patient Education  2017 [employer].

## 2024-01-04 DIAGNOSIS — H1032 Unspecified acute conjunctivitis, left eye: Secondary | ICD-10-CM | POA: Diagnosis not present

## 2024-01-04 DIAGNOSIS — J0120 Acute ethmoidal sinusitis, unspecified: Secondary | ICD-10-CM | POA: Diagnosis not present

## 2024-01-05 ENCOUNTER — Telehealth: Payer: Self-pay | Admitting: Family Medicine

## 2024-01-05 NOTE — Telephone Encounter (Signed)
 Copied from CRM [id number]. Topic: General - Other >> Jan 04, 2024  4:06 PM Zebedee SAUNDERS wrote: Reason for CRM: Pt wants to know if something can be sent in for her pink eye,conjunctiva, to  [id number] [address], [place] - [address] [place] [address] Phone: [phone] Fax: [phone] Please call pt at [phone].

## 2024-01-11 ENCOUNTER — Other Ambulatory Visit: Payer: Self-pay

## 2024-01-11 ENCOUNTER — Telehealth: Payer: Self-pay | Admitting: Family Medicine

## 2024-01-11 DIAGNOSIS — J029 Acute pharyngitis, unspecified: Secondary | ICD-10-CM

## 2024-01-11 MED ORDER — FLUTICASONE PROPIONATE 50 MCG/ACT NA SUSP: NASAL | 0 refills | Status: AC

## 2024-01-11 NOTE — Telephone Encounter (Signed)
 Prescription Request  01/11/2024  LOV: 11/14/2023  What is the name of the medication or equipment?   fluticasone  (FLONASE ) 50 MCG/ACT nasal spray   Have you contacted your pharmacy to request a refill? Yes   Which pharmacy would you like this sent to?  [id number], [place] - [address] [place]  [address] Phone: [phone] Fax: [phone]    Patient notified that their request is being sent to the clinical staff for review and that they should receive a response within 2 business days.   Please advise pharmacist.

## 2024-01-11 NOTE — Telephone Encounter (Signed)
 Sent in medication

## 2024-05-09 IMAGING — DX DG RIBS W/ CHEST 3+V*R*
3 series · 3 of 3 positions shown · non-contrast
Comparison: Chest x-ray 07/20/2018

CLINICAL DATA: Right rib pain, fall.

EXAM:
RIGHT RIBS AND CHEST - 3+ VIEW

[dg ribs unilateral w/chest right (1 of 3)]
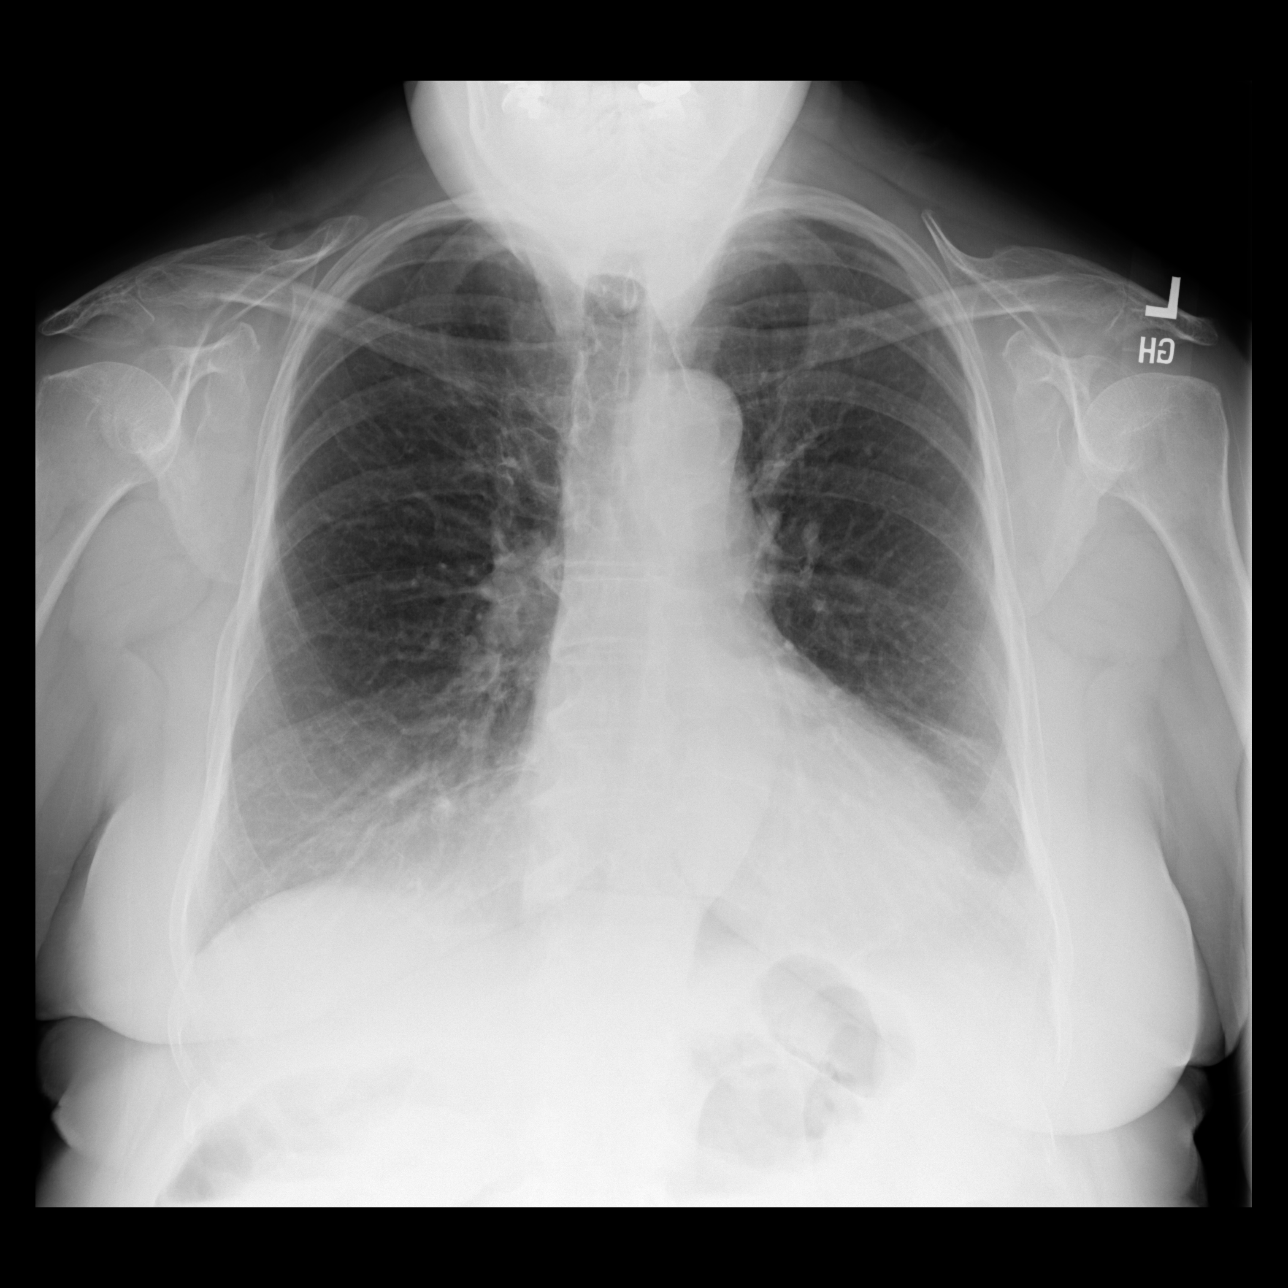

[dg ribs unilateral w/chest right (2 of 3)]
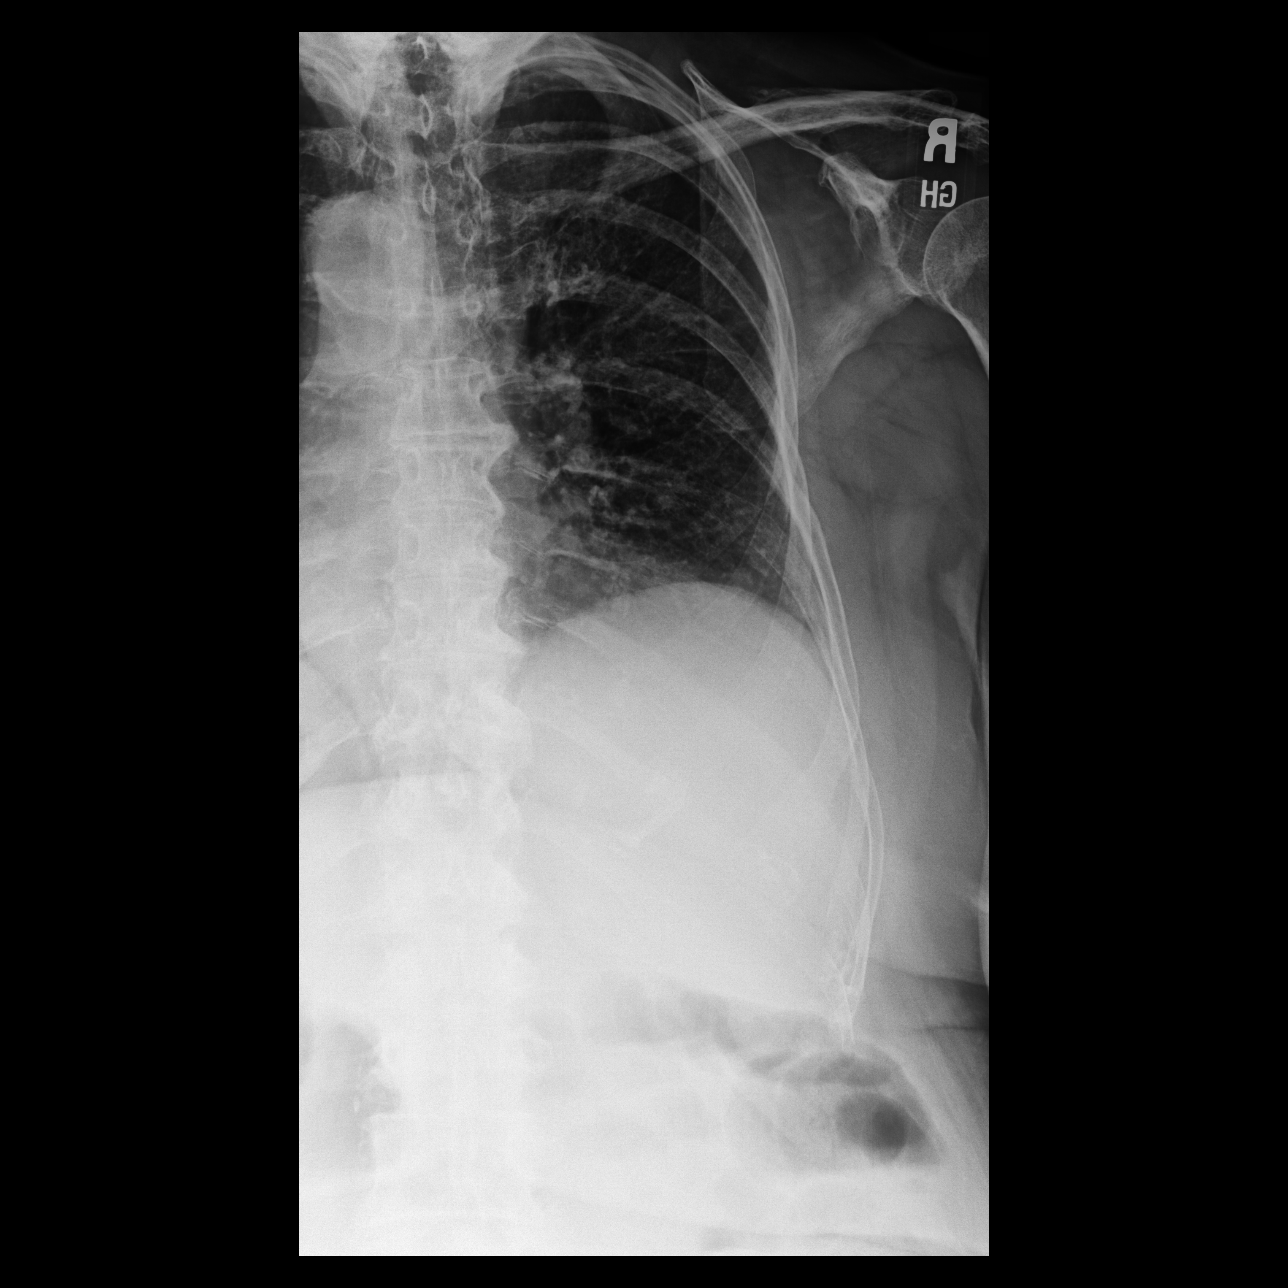

[dg ribs unilateral w/chest right (3 of 3)]
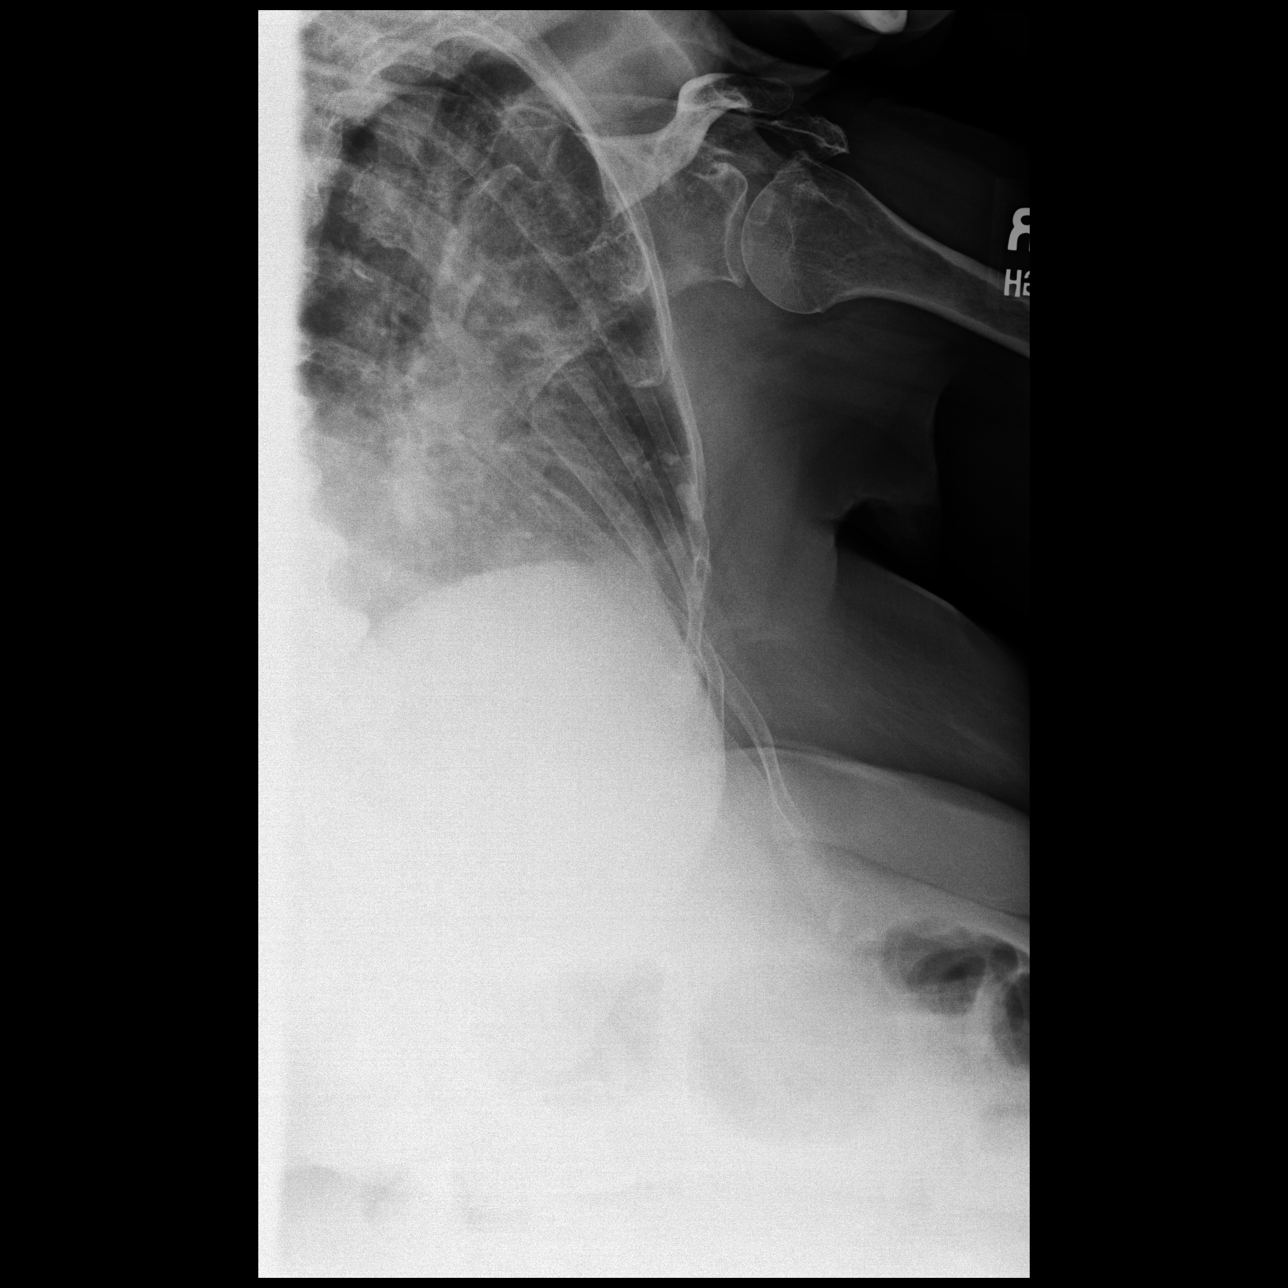

[3 of 3 positions shown; findings below may reference images not displayed]

FINDINGS: No fracture or other bone lesions are seen involving the ribs. There
is no evidence of pneumothorax or pleural effusion. Both lungs are
clear. Heart size and mediastinal contours are within normal limits.
IMPRESSION: Negative.

## 2024-05-09 IMAGING — DX DG HIP (WITH OR WITHOUT PELVIS) 2-3V*R*
2 series · 2 of 2 positions shown · non-contrast
Comparison: None Available.

CLINICAL DATA: Pain, fall 4 days earlier

EXAM:
DG HIP (WITH OR WITHOUT PELVIS) 2-3V RIGHT

[dg hip unilat w or w/o pelvis 2-3 views  (1 of 2)]
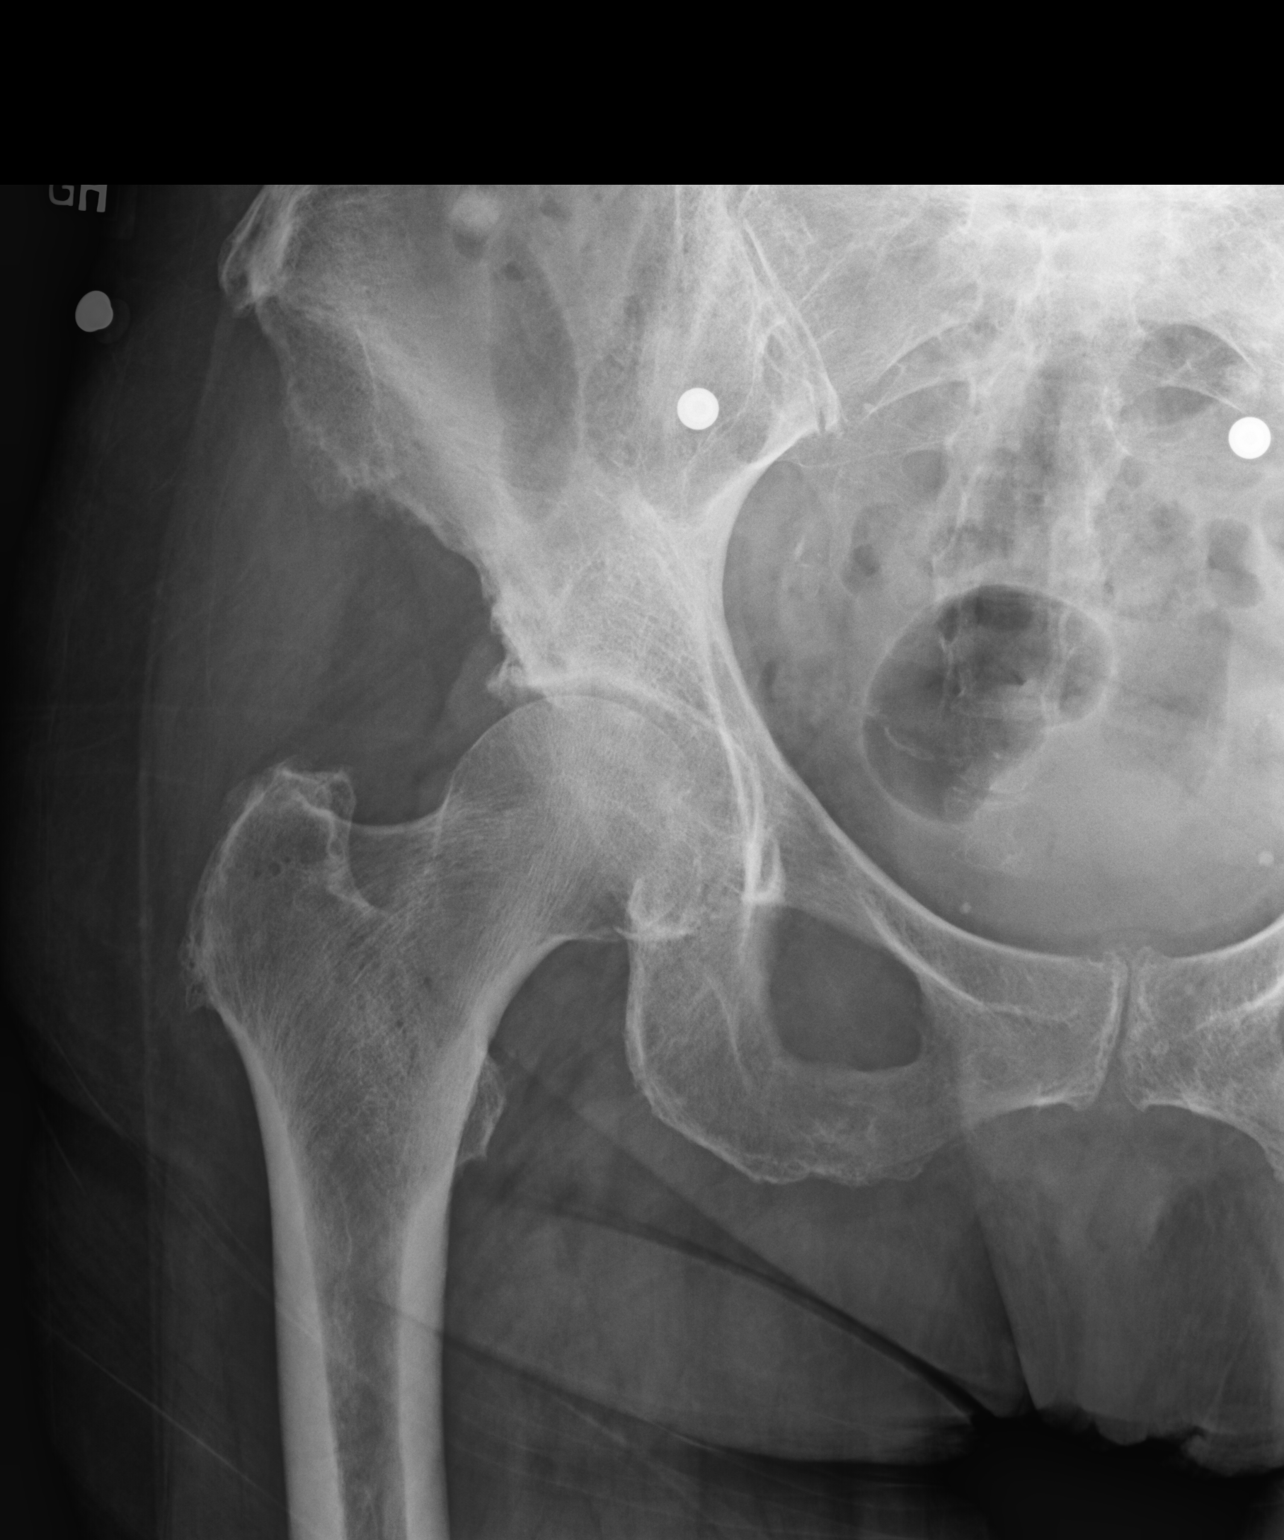

[dg hip unilat w or w/o pelvis 2-3 views  (2 of 2)]
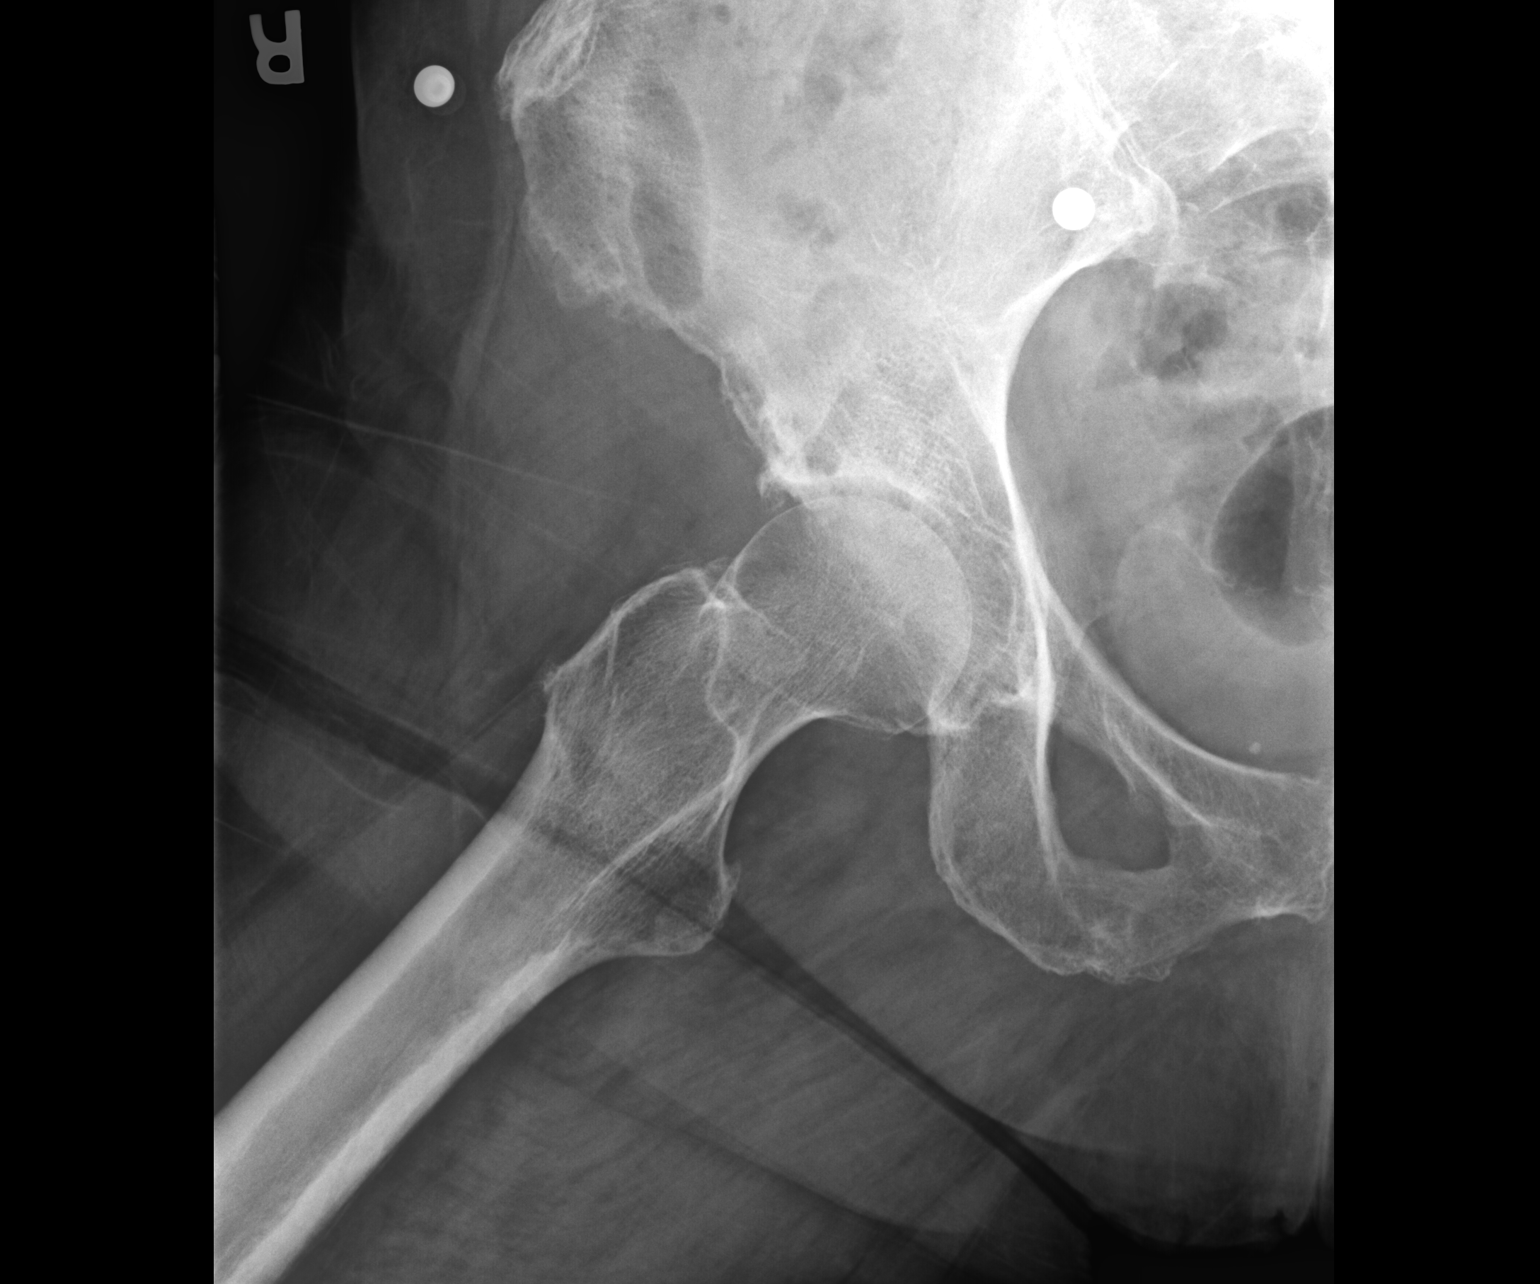

[2 of 2 positions shown; findings below may reference images not displayed]

FINDINGS: No fracture or dislocation is seen. Degenerative changes are noted
in the right hip with bony spurs, especially in the lateral aspect
of acetabulum.
IMPRESSION: No fracture or dislocation is seen in the right hip. Degenerative
changes are noted in the right hip with bony spurs.

## 2024-10-24 ENCOUNTER — Ambulatory Visit (HOSPITAL_BASED_OUTPATIENT_CLINIC_OR_DEPARTMENT_OTHER)

## 2024-11-08 ENCOUNTER — Other Ambulatory Visit

## 2024-11-13 ENCOUNTER — Encounter: Admitting: Family Medicine

## 2024-12-20 ENCOUNTER — Encounter
